# Patient Record
Sex: Female | Born: 1965 | Race: Black or African American | Hispanic: No | Marital: Married | Smoking: Former smoker
Health system: Southern US, Community
[De-identification: ages and names within clinical notes are randomized; demographics above are authoritative.]

## PROBLEM LIST (undated history)

## (undated) DIAGNOSIS — L409 Psoriasis, unspecified: Secondary | ICD-10-CM

## (undated) DIAGNOSIS — Z8619 Personal history of other infectious and parasitic diseases: Secondary | ICD-10-CM

## (undated) DIAGNOSIS — T7840XA Allergy, unspecified, initial encounter: Secondary | ICD-10-CM

## (undated) DIAGNOSIS — R7611 Nonspecific reaction to tuberculin skin test without active tuberculosis: Secondary | ICD-10-CM

## (undated) HISTORY — DX: Nonspecific reaction to tuberculin skin test without active tuberculosis: R76.11

## (undated) HISTORY — DX: Allergy, unspecified, initial encounter: T78.40XA

## (undated) HISTORY — PX: TUBAL LIGATION: SHX77

## (undated) HISTORY — DX: Personal history of other infectious and parasitic diseases: Z86.19

---

## 2007-11-21 ENCOUNTER — Encounter: Admission: RE | Admit: 2007-11-21 | Discharge: 2007-11-21 | Payer: Self-pay | Admitting: Obstetrics and Gynecology

## 2008-04-05 ENCOUNTER — Other Ambulatory Visit: Admission: RE | Admit: 2008-04-05 | Discharge: 2008-04-05 | Payer: Self-pay | Admitting: Family Medicine

## 2009-10-23 ENCOUNTER — Encounter: Admission: RE | Admit: 2009-10-23 | Discharge: 2009-10-23 | Payer: Self-pay | Admitting: Family Medicine

## 2010-01-22 ENCOUNTER — Other Ambulatory Visit
Admission: RE | Admit: 2010-01-22 | Discharge: 2010-01-22 | Payer: Self-pay | Source: Home / Self Care | Admitting: Family Medicine

## 2010-01-29 ENCOUNTER — Encounter
Admission: RE | Admit: 2010-01-29 | Discharge: 2010-01-29 | Payer: Self-pay | Source: Home / Self Care | Attending: Family Medicine | Admitting: Family Medicine

## 2010-12-18 LAB — HM MAMMOGRAPHY: HM Mammogram: NORMAL

## 2011-03-30 LAB — HM PAP SMEAR: HM PAP: NORMAL

## 2012-06-14 ENCOUNTER — Ambulatory Visit
Admission: RE | Admit: 2012-06-14 | Discharge: 2012-06-14 | Disposition: A | Discharge status: Home / Self Care | Payer: No Typology Code available for payment source | Source: Ambulatory Visit | Attending: Infectious Diseases | Admitting: Infectious Diseases

## 2012-06-14 ENCOUNTER — Other Ambulatory Visit: Payer: Self-pay | Admitting: Infectious Diseases

## 2012-06-14 DIAGNOSIS — R7611 Nonspecific reaction to tuberculin skin test without active tuberculosis: Secondary | ICD-10-CM

## 2013-03-28 ENCOUNTER — Telehealth: Payer: Self-pay

## 2013-03-28 NOTE — Telephone Encounter (Signed)
Left message for call back. Identifiable.   Pap-01/22/10-negative MMG-10/23/09-benign findings-Left breast; 01/29/10-benign cyst of right breast

## 2013-03-29 ENCOUNTER — Ambulatory Visit (INDEPENDENT_AMBULATORY_CARE_PROVIDER_SITE_OTHER): Payer: BC Managed Care – PPO | Admitting: Family Medicine

## 2013-03-29 ENCOUNTER — Encounter: Payer: Self-pay | Admitting: Family Medicine

## 2013-03-29 VITALS — BP 136/80 | HR 75 | Temp 98.1°F | Resp 16 | Ht 64.0 in | Wt 144.0 lb

## 2013-03-29 DIAGNOSIS — L409 Psoriasis, unspecified: Secondary | ICD-10-CM

## 2013-03-29 DIAGNOSIS — K219 Gastro-esophageal reflux disease without esophagitis: Secondary | ICD-10-CM

## 2013-03-29 DIAGNOSIS — L408 Other psoriasis: Secondary | ICD-10-CM

## 2013-03-29 DIAGNOSIS — Z8619 Personal history of other infectious and parasitic diseases: Secondary | ICD-10-CM

## 2013-03-29 DIAGNOSIS — R7611 Nonspecific reaction to tuberculin skin test without active tuberculosis: Secondary | ICD-10-CM | POA: Insufficient documentation

## 2013-03-29 MED ORDER — TRIAMCINOLONE ACETONIDE 0.1 % EX OINT: 1.0000 "application " | TOPICAL_OINTMENT | Freq: Two times a day (BID) | CUTANEOUS | Status: DC

## 2013-03-29 MED ORDER — PANTOPRAZOLE SODIUM 40 MG PO TBEC: 40.0000 mg | DELAYED_RELEASE_TABLET | Freq: Every day | ORAL | Status: DC

## 2013-03-29 NOTE — Assessment & Plan Note (Signed)
New to provider, ongoing for pt.  Only completed 1 month of rifampin tx (rather than the 3 months prescribed).  Will talk to ID and ask whether pt can finish last 2 months or whether she needs to start tx over.

## 2013-03-29 NOTE — Patient Instructions (Signed)
Schedule your complete physical in 3-4 months Start the Protonix daily for the reflux We'll notify you of your lab results and make any changes if needed Use the Triamcinolone twice daily on the psoriasis We'll let you know about the TB treatment Call with any questions or concerns Welcome!  We're glad to have you!

## 2013-03-29 NOTE — Assessment & Plan Note (Signed)
New to provider, ongoing for pt.  Start topical steroid ointment.  Will refer to derm prn.

## 2013-03-29 NOTE — Progress Notes (Signed)
   Subjective:    Patient ID: Madison Best, female    DOB: 08/03/1965, 48 y.o.   MRN: [id number]  HPI New to establish.  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  Hx of H pylori infxn.  Psoriasis- ongoing problem for pt, not currently being treated.  Was seeing Derm but was planning on changing doctors.  Would like to start tx w/ topical steroid.  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  Only took 1 month as opposed to 3 month recommended course.  Review of Systems For ROS see HPI     Objective:   Physical Exam  Constitutional: She is oriented to person, place, and time. She appears well-developed and well-nourished. No distress.  HENT:  Head: Normocephalic and atraumatic.  Eyes: Conjunctivae and EOM are normal. Pupils are equal, round, and reactive to light.  Neck: Normal range of motion. Neck supple. No thyromegaly present.  Cardiovascular: Normal rate, regular rhythm, normal heart sounds and intact distal pulses.   No murmur heard. Pulmonary/Chest: Effort normal and breath sounds normal. No respiratory distress.  Abdominal: Soft. She exhibits no distension. There is no tenderness.  Musculoskeletal: She exhibits no edema.  Lymphadenopathy:    She has no cervical adenopathy.  Neurological: She is alert and oriented to person, place, and time.  Skin: Skin is warm and dry.  Dry, scaling plaques on extensor surfaces of arms bilaterally  Psychiatric: She has a normal mood and affect. Her behavior is normal.          Assessment & Plan:

## 2013-03-29 NOTE — Assessment & Plan Note (Signed)
New to provider, ongoing for pt.  Start PPI.  Check labs to r/o H pylori.

## 2013-03-29 NOTE — Progress Notes (Signed)
Pre visit review using our clinic review tool, if applicable. No additional management support is needed unless otherwise documented below in the visit note. 

## 2013-03-29 NOTE — Assessment & Plan Note (Signed)
New to provider.  Pt again having sxs that could be consistent.  Check labs.  Start PPI.  Will follow.

## 2013-03-30 LAB — H. PYLORI ANTIBODY, IGG: H PYLORI IGG: NEGATIVE

## 2013-03-31 ENCOUNTER — Other Ambulatory Visit: Payer: Self-pay | Admitting: General Practice

## 2013-03-31 MED ORDER — RIFAMPIN 300 MG PO CAPS: 300.0000 mg | ORAL_CAPSULE | Freq: Two times a day (BID) | ORAL | Status: DC

## 2013-03-31 NOTE — Telephone Encounter (Signed)
Unable to reach pre visit.  

## 2013-07-25 ENCOUNTER — Encounter: Payer: Self-pay | Admitting: Family Medicine

## 2013-07-25 ENCOUNTER — Ambulatory Visit (INDEPENDENT_AMBULATORY_CARE_PROVIDER_SITE_OTHER): Payer: BC Managed Care – PPO | Admitting: Family Medicine

## 2013-07-25 VITALS — BP 120/70 | HR 67 | Temp 98.2°F | Ht 64.0 in | Wt 147.0 lb

## 2013-07-25 DIAGNOSIS — M62838 Other muscle spasm: Secondary | ICD-10-CM | POA: Insufficient documentation

## 2013-07-25 MED ORDER — MELOXICAM 15 MG PO TABS: 15.0000 mg | ORAL_TABLET | Freq: Every day | ORAL | Status: DC

## 2013-07-25 MED ORDER — CYCLOBENZAPRINE HCL 10 MG PO TABS: 10.0000 mg | ORAL_TABLET | Freq: Three times a day (TID) | ORAL | Status: DC | PRN

## 2013-07-25 NOTE — Progress Notes (Signed)
Pre visit review using our clinic review tool, if applicable. No additional management support is needed unless otherwise documented below in the visit note. 

## 2013-07-25 NOTE — Patient Instructions (Signed)
Follow up as needed This is a trapezius (muscle) spasm Start the Meloxicam daily for inflammation- take w/ food Use the cyclobenzaprine (flexeril) 3x/daily for spasm- may cause drowsiness HEAT! Do some gentle stretching- turn until you feel it pull and then hold.  Do this in all directions Call with any questions or concerns Hang in there!!

## 2013-07-25 NOTE — Progress Notes (Signed)
   Subjective:    Patient ID: Madison Best, female    DOB: 06-14-1965, 48 y.o.   MRN: [id number]  HPI L sided neck pain- started yesterday and was mild.  When she woke up this AM, 'it was awful'.  Unable to straighten neck.  + muscle spasm.  No known injury.  No radiation of pain into arm.  No HA.   Review of Systems For ROS see HPI     Objective:   Physical Exam  Vitals reviewed. Constitutional: She appears well-developed and well-nourished.  Obviously uncomfortable  HENT:  Head: Normocephalic and atraumatic.  Neck:  + L trap spasm Full flexion and extension Able to look R w/o difficulty, severe spasm and pain w/ looking L  Lymphadenopathy:    She has no cervical adenopathy.          Assessment & Plan:

## 2013-07-25 NOTE — Assessment & Plan Note (Addendum)
New.  No red flags on hx or PE.  Start scheduled NSAIDs and muscle relaxer.  Heat.  If no improvement, recommended [facility] to pt.  Reviewed supportive care and red flags that should prompt return.  Pt expressed understanding and is in agreement w/ plan.

## 2013-10-02 ENCOUNTER — Ambulatory Visit (INDEPENDENT_AMBULATORY_CARE_PROVIDER_SITE_OTHER): Payer: BC Managed Care – PPO | Admitting: Internal Medicine

## 2013-10-02 ENCOUNTER — Encounter: Payer: Self-pay | Admitting: Internal Medicine

## 2013-10-02 ENCOUNTER — Ambulatory Visit (HOSPITAL_BASED_OUTPATIENT_CLINIC_OR_DEPARTMENT_OTHER)
Admission: RE | Admit: 2013-10-02 | Discharge: 2013-10-02 | Disposition: A | Discharge status: Home / Self Care | Payer: BC Managed Care – PPO | Source: Ambulatory Visit | Attending: Internal Medicine | Admitting: Internal Medicine

## 2013-10-02 VITALS — BP 99/58 | HR 75 | Temp 98.3°F | Wt 145.4 lb

## 2013-10-02 DIAGNOSIS — S8990XA Unspecified injury of unspecified lower leg, initial encounter: Secondary | ICD-10-CM | POA: Insufficient documentation

## 2013-10-02 DIAGNOSIS — S99929A Unspecified injury of unspecified foot, initial encounter: Principal | ICD-10-CM

## 2013-10-02 DIAGNOSIS — S99919A Unspecified injury of unspecified ankle, initial encounter: Secondary | ICD-10-CM | POA: Diagnosis not present

## 2013-10-02 DIAGNOSIS — S99921A Unspecified injury of right foot, initial encounter: Secondary | ICD-10-CM

## 2013-10-02 DIAGNOSIS — W208XXA Other cause of strike by thrown, projected or falling object, initial encounter: Secondary | ICD-10-CM | POA: Insufficient documentation

## 2013-10-02 DIAGNOSIS — M7989 Other specified soft tissue disorders: Secondary | ICD-10-CM | POA: Diagnosis not present

## 2013-10-02 DIAGNOSIS — M79609 Pain in unspecified limb: Secondary | ICD-10-CM | POA: Diagnosis present

## 2013-10-02 NOTE — Progress Notes (Signed)
   Subjective:    Patient ID: Madison Best, female    DOB: 03/01/1965, 48 y.o.   MRN: [id number]  DOS:  10/02/2013 Type of visit - description: acute History: A week ago he was going down the stairs at her yard, missed a step, had a minor excoriation at the second right toe and he twisted the anterior foot. Overall , foot is now is better. 3 days later developed mild pain at the anterior knee,  more like a "ache".    ROS Denies fever chills No discharge from the excoriation No knee swelling or redness per se  Past Medical History  Diagnosis Date  . History of chicken pox   . Allergy   . Positive TB test     Past Surgical History  Procedure Laterality Date  . Tubal ligation      History   Social History  . Marital Status: Married    Spouse Name: N/A    Number of Children: N/A  . Years of Education: N/A   Occupational History  . Not on file.   Social History Main Topics  . Smoking status: Former Smoker    Quit date: 03/29/2012  . Smokeless tobacco: Never Used  . Alcohol Use: No  . Drug Use: No  . Sexual Activity: Yes   Other Topics Concern  . Not on file   Social History Narrative  . No narrative on file        Medication List       This list is accurate as of: 10/02/13  7:17 PM.  Always use your most recent med list.               cyclobenzaprine 10 MG tablet  Commonly known as:  FLEXERIL  Take 1 tablet (10 mg total) by mouth 3 (three) times daily as needed for muscle spasms.     meloxicam 15 MG tablet  Commonly known as:  MOBIC  Take 1 tablet (15 mg total) by mouth daily.     pantoprazole 40 MG tablet  Commonly known as:  PROTONIX  Take 1 tablet (40 mg total) by mouth daily.     triamcinolone ointment 0.1 %  Commonly known as:  KENALOG  Apply 1 application topically 2 (two) times daily.           Objective:   Physical Exam BP 99/58  Pulse 75  Temp(Src) 98.3 F (36.8 C) (Oral)  Wt 145 lb 6 oz (65.942 kg)  SpO2 98%  LMP  09/18/2013  General -- alert, well-developed, NAD.   Extremities--  no pretibial edema bilaterally  Knees symmetric and normal to inspection and palpation L foot normal R foot normal except for a small (3x2 mm) excoriation at 2nd toe w/o redness or d/d.  The R  2nd  toe has mild ecchymosis, minimal swelling, no deformities, and is tender at the base  Neurologic--  alert & oriented X3. Speech normal, gait appropriate for age, strength symmetric and appropriate for age.  Psych-- Cognition and judgment appear intact. Cooperative with normal attention span and concentration. No anxious or depressed appearing.      Assessment & Plan:   Injury, Toe injury , get a x-ray to be sure there is no occult fracture. If x-rays negative for recommend observation. She has a small excoriation without evidence of cellulitis, she's up-to-date on her tetanus shot.  Knee pain, exam is negative, recommend eyes, meloxicam and observation.

## 2013-10-02 NOTE — Progress Notes (Signed)
Pre-visit discussion using our clinic review tool. No additional management support is needed unless otherwise documented below in the visit note.  

## 2013-10-02 NOTE — Patient Instructions (Signed)
Get the XR at [facility] [facility], corner [address] (10 minutes form here); they are open 24/7 [address]  [place], [place] [address] [phone]  Take meloxicam once a day as needed ICE the knee twice a day as needed  Call if no better in few days

## 2014-01-03 ENCOUNTER — Ambulatory Visit (INDEPENDENT_AMBULATORY_CARE_PROVIDER_SITE_OTHER): Payer: BC Managed Care – PPO | Admitting: Family Medicine

## 2014-01-03 ENCOUNTER — Encounter: Payer: Self-pay | Admitting: Family Medicine

## 2014-01-03 VITALS — BP 106/76 | HR 75 | Temp 98.4°F | Resp 16 | Ht 63.5 in | Wt 135.2 lb

## 2014-01-03 DIAGNOSIS — B360 Pityriasis versicolor: Secondary | ICD-10-CM

## 2014-01-03 DIAGNOSIS — Z0000 Encounter for general adult medical examination without abnormal findings: Secondary | ICD-10-CM

## 2014-01-03 DIAGNOSIS — Z1231 Encounter for screening mammogram for malignant neoplasm of breast: Secondary | ICD-10-CM

## 2014-01-03 MED ORDER — KETOCONAZOLE 2 % EX CREA: 1.0000 "application " | TOPICAL_CREAM | Freq: Every day | CUTANEOUS | Status: DC

## 2014-01-03 NOTE — Progress Notes (Signed)
   Subjective:    Patient ID: Madison Best, female    DOB: 06/02/1965, 48 y.o.   MRN: [id number]  HPI CPE- due for mammo, too young for colonoscopy, UTD on pap- due next year.   Review of Systems Patient reports no vision/ hearing changes, adenopathy,fever, weight change,  persistant/recurrent hoarseness , swallowing issues, chest pain, palpitations, edema, persistant/recurrent cough, hemoptysis, dyspnea (rest/exertional/paroxysmal nocturnal), gastrointestinal bleeding (melena, rectal bleeding), abdominal pain, significant heartburn, bowel changes, GU symptoms (dysuria, hematuria, incontinence), Gyn symptoms (abnormal  bleeding, pain),  syncope, focal weakness, memory loss, numbness & tingling, skin/hair/nail changes, abnormal bruising or bleeding, anxiety, or depression.     Objective:   Physical Exam General Appearance:    Alert, cooperative, no distress, appears stated age  Head:    Normocephalic, without obvious abnormality, atraumatic  Eyes:    PERRL, conjunctiva/corneas clear, EOM's intact, fundi    benign, both eyes  Ears:    Normal TM's and external ear canals, both ears  Nose:   Nares normal, septum midline, mucosa normal, no drainage    or sinus tenderness  Throat:   Lips, mucosa, and tongue normal; teeth and gums normal  Neck:   Supple, symmetrical, trachea midline, no adenopathy;    Thyroid: no enlargement/tenderness/nodules  Back:     Symmetric, no curvature, ROM normal, no CVA tenderness  Lungs:     Clear to auscultation bilaterally, respirations unlabored  Chest Wall:    No tenderness or deformity   Heart:    Regular rate and rhythm, S1 and S2 normal, no murmur, rub   or gallop  Breast Exam:    Deferred to GYN  Abdomen:     Soft, non-tender, bowel sounds active all four quadrants,    no masses, no organomegaly  Genitalia:    Deferred to GYN  Rectal:    Extremities:   Extremities normal, atraumatic, no cyanosis or edema  Pulses:   2+ and symmetric all extremities  Skin:    Skin color, texture, turgor normal, tinea versicolor on chest  Lymph nodes:   Cervical, supraclavicular, and axillary nodes normal  Neurologic:   CNII-XII intact, normal strength, sensation and reflexes    throughout          Assessment & Plan:

## 2014-01-03 NOTE — Assessment & Plan Note (Signed)
Pt's PE WNL.  UTD on pap- due next year.  Overdue on mammo- order entered.  Check labs.  Anticipatory guidance provided.

## 2014-01-03 NOTE — Assessment & Plan Note (Signed)
mammo ordered.

## 2014-01-03 NOTE — Assessment & Plan Note (Signed)
New.  Reviewed dx w/ pt.  Start ketoconazole cream.

## 2014-01-03 NOTE — Patient Instructions (Signed)
Follow up in 1 year or as needed We'll notify you of your lab results and make any changes if needed We'll call you with your mammogram appt Apply the ketoconazole cream daily for the tinea versicolor Call with any questions or concerns Happy Holidays!!!

## 2014-01-03 NOTE — Progress Notes (Signed)
Pre visit review using our clinic review tool, if applicable. No additional management support is needed unless otherwise documented below in the visit note. 

## 2014-01-04 LAB — LIPID PANEL
CHOLESTEROL: 111 mg/dL (ref 0–200)
HDL: 41.6 mg/dL (ref >39.00)
LDL CALC: 52 mg/dL (ref 0–99)
NonHDL: 69.4
Total CHOL/HDL Ratio: 3
Triglycerides: 86 mg/dL (ref 0.0–149.0)
VLDL: 17.2 mg/dL (ref 0.0–40.0)

## 2014-01-04 LAB — BASIC METABOLIC PANEL
BUN: 8 mg/dL (ref 6–23)
CALCIUM: 9.2 mg/dL (ref 8.4–10.5)
CO2: 25 mEq/L (ref 19–32)
CREATININE: 0.6 mg/dL (ref 0.4–1.2)
Chloride: 104 mEq/L (ref 96–112)
GFR: 134.71 mL/min (ref >60.00)
Glucose, Bld: 58 mg/dL — ABNORMAL LOW (ref 70–99)
POTASSIUM: 3.9 meq/L (ref 3.5–5.1)
Sodium: 137 mEq/L (ref 135–145)

## 2014-01-04 LAB — CBC WITH DIFFERENTIAL/PLATELET
BASOS ABS: 0 10*3/uL (ref 0.0–0.1)
BASOS PCT: 0.3 % (ref 0.0–3.0)
EOS ABS: 0.1 10*3/uL (ref 0.0–0.7)
EOS PCT: 1.5 % (ref 0.0–5.0)
HCT: 38.4 % (ref 36.0–46.0)
Hemoglobin: 12.3 g/dL (ref 12.0–15.0)
LYMPHS ABS: 2.9 10*3/uL (ref 0.7–4.0)
Lymphocytes Relative: 32.7 % (ref 12.0–46.0)
MCHC: 32 g/dL (ref 30.0–36.0)
MCV: 92.8 fl (ref 78.0–100.0)
Monocytes Absolute: 0.4 10*3/uL (ref 0.1–1.0)
Monocytes Relative: 4.2 % (ref 3.0–12.0)
NEUTROS ABS: 5.4 10*3/uL (ref 1.4–7.7)
NEUTROS PCT: 61.3 % (ref 43.0–77.0)
Platelets: 339 10*3/uL (ref 150.0–400.0)
RBC: 4.14 Mil/uL (ref 3.87–5.11)
RDW: 13.6 % (ref 11.5–15.5)
WBC: 8.9 10*3/uL (ref 4.0–10.5)

## 2014-01-04 LAB — HEPATIC FUNCTION PANEL
ALBUMIN: 4.3 g/dL (ref 3.5–5.2)
ALK PHOS: 48 U/L (ref 39–117)
ALT: 11 U/L (ref 0–35)
AST: 15 U/L (ref 0–37)
BILIRUBIN DIRECT: 0 mg/dL (ref 0.0–0.3)
Total Bilirubin: 0.3 mg/dL (ref 0.2–1.2)
Total Protein: 6.9 g/dL (ref 6.0–8.3)

## 2014-01-04 LAB — TSH: TSH: 0.86 u[IU]/mL (ref 0.35–4.50)

## 2014-01-04 LAB — VITAMIN D 25 HYDROXY (VIT D DEFICIENCY, FRACTURES): VITD: 14.62 ng/mL — ABNORMAL LOW (ref 30.00–100.00)

## 2014-01-05 ENCOUNTER — Other Ambulatory Visit: Payer: Self-pay | Admitting: General Practice

## 2014-01-05 MED ORDER — VITAMIN D (ERGOCALCIFEROL) 1.25 MG (50000 UNIT) PO CAPS: 50000.0000 [IU] | ORAL_CAPSULE | ORAL | Status: DC

## 2014-01-30 ENCOUNTER — Ambulatory Visit: Payer: BC Managed Care – PPO

## 2014-02-06 ENCOUNTER — Ambulatory Visit
Admission: RE | Admit: 2014-02-06 | Discharge: 2014-02-06 | Disposition: A | Discharge status: Home / Self Care | Payer: BC Managed Care – PPO | Source: Ambulatory Visit | Attending: Family Medicine | Admitting: Family Medicine

## 2014-02-06 DIAGNOSIS — Z1231 Encounter for screening mammogram for malignant neoplasm of breast: Secondary | ICD-10-CM

## 2014-07-23 ENCOUNTER — Encounter: Payer: Self-pay | Admitting: Medical

## 2014-07-23 ENCOUNTER — Ambulatory Visit (INDEPENDENT_AMBULATORY_CARE_PROVIDER_SITE_OTHER): Payer: BLUE CROSS/BLUE SHIELD | Admitting: Medical

## 2014-07-23 VITALS — BP 130/80 | HR 81 | Temp 98.6°F | Ht 63.5 in | Wt 135.0 lb

## 2014-07-23 DIAGNOSIS — T148 Other injury of unspecified body region: Secondary | ICD-10-CM

## 2014-07-23 DIAGNOSIS — W57XXXA Bitten or stung by nonvenomous insect and other nonvenomous arthropods, initial encounter: Secondary | ICD-10-CM | POA: Diagnosis not present

## 2014-07-23 MED ORDER — DOXYCYCLINE HYCLATE 100 MG PO TABS: ORAL_TABLET | ORAL | Status: DC

## 2014-07-23 NOTE — Patient Instructions (Signed)
Tick bite  Tick with mild early skin infection secondary to trauma/bite. Rx doxycycline. Rx advisement given.  Tick bit studies. Follow up in 7 days or as needed.

## 2014-07-23 NOTE — Assessment & Plan Note (Signed)
Tick with mild early skin infection secondary to trauma/bite. Rx doxycycline. Rx advisement given.  Tick bit studies. Follow up in 7 days or as needed.

## 2014-07-23 NOTE — Progress Notes (Signed)
   Subjective:    Patient ID: Madison Best, female    DOB: 06/25/1965, 49 y.o.   MRN: [id number]  HPI  Pt felt tick on her back on Tuesday. Pt daughter pulled it off with her finger. Area was itching for about 6 days. No pain. No fever, and no chills.  Pt not sexualy active and tubal ligation in the past.   Review of Systems  Constitutional: Negative for fever, chills and fatigue.  Respiratory: Negative for cough, chest tightness, shortness of breath and wheezing.   Cardiovascular: Negative for chest pain and palpitations.  Musculoskeletal: Negative for myalgias and arthralgias.  Skin: Positive for rash.       Small tick bite left side of back with scab. Faint redness around scab.    Past Medical History  Diagnosis Date  . History of chicken pox   . Allergy   . Positive TB test     History   Social History  . Marital Status: Married    Spouse Name: N/A  . Number of Children: N/A  . Years of Education: N/A   Occupational History  . Not on file.   Social History Main Topics  . Smoking status: Current Every Day Smoker    Last Attempt to Quit: 03/29/2012  . Smokeless tobacco: Never Used  . Alcohol Use: No  . Drug Use: No  . Sexual Activity: Yes   Other Topics Concern  . Not on file   Social History Narrative    Past Surgical History  Procedure Laterality Date  . Tubal ligation      Family History  Problem Relation Age of Onset  . Arthritis Mother   . Hypertension Mother   . Diabetes Father   . Cancer Sister     Breast/Brain tumors  . Stroke Paternal Grandmother   . Diabetes Paternal Grandmother     No Known Allergies  Current Outpatient Prescriptions on File Prior to Visit  Medication Sig Dispense Refill  . ketoconazole (NIZORAL) 2 % cream Apply 1 application topically daily. 60 g 1  . triamcinolone ointment (KENALOG) 0.1 % Apply 1 application topically 2 (two) times daily. 90 g 1  . Vitamin D, Ergocalciferol, (DRISDOL) 50000 UNITS CAPS capsule Take  1 capsule (50,000 Units total) by mouth every 7 (seven) days. 12 capsule 0   No current facility-administered medications on file prior to visit.    BP 130/80 mmHg  Pulse 81  Temp(Src) 98.6 F (37 C) (Oral)  Ht 5' 3.5" (1.613 m)  Wt 135 lb (61.236 kg)  BMI 23.54 kg/m2  SpO2 98%  LMP 07/19/2014       Objective:   Physical Exam  General- No acute distress. Pleasant patient. Neck- Full range of motion, no jvd Lungs- Clear, even and unlabored. Heart- regular rate and rhythm. Neurologic- CNII- XII grossly intact. Skin- small red, indurated area and scab where tick was attached beneath left scapula area.      Assessment & Plan:

## 2014-07-23 NOTE — Progress Notes (Signed)
Pre visit review using our clinic review tool, if applicable. No additional management support is needed unless otherwise documented below in the visit note. 

## 2014-07-24 LAB — LYME ABY, WSTRN BLT IGG & IGM W/BANDS
B BURGDORFERI IGG ABS (IB): NEGATIVE
B burgdorferi IgM Abs (IB): NEGATIVE
LYME DISEASE 28 KD IGG: NONREACTIVE
LYME DISEASE 30 KD IGG: NONREACTIVE
LYME DISEASE 66 KD IGG: NONREACTIVE
Lyme Disease 18 kD IgG: NONREACTIVE
Lyme Disease 23 kD IgG: NONREACTIVE
Lyme Disease 23 kD IgM: NONREACTIVE
Lyme Disease 39 kD IgG: NONREACTIVE
Lyme Disease 39 kD IgM: NONREACTIVE
Lyme Disease 41 kD IgG: NONREACTIVE
Lyme Disease 41 kD IgM: NONREACTIVE
Lyme Disease 45 kD IgG: NONREACTIVE
Lyme Disease 58 kD IgG: NONREACTIVE
Lyme Disease 93 kD IgG: NONREACTIVE

## 2014-07-24 LAB — ROCKY MTN SPOTTED FVR ABS PNL(IGG+IGM)
RMSF IGG: 0.21 IV
RMSF IGM: 0.84 IV

## 2015-01-07 ENCOUNTER — Encounter: Payer: BC Managed Care – PPO | Admitting: Family Medicine

## 2015-01-17 ENCOUNTER — Ambulatory Visit (INDEPENDENT_AMBULATORY_CARE_PROVIDER_SITE_OTHER): Payer: BLUE CROSS/BLUE SHIELD | Admitting: Family Medicine

## 2015-01-17 ENCOUNTER — Encounter: Payer: Self-pay | Admitting: Family Medicine

## 2015-01-17 VITALS — BP 124/84 | HR 92 | Temp 97.9°F | Resp 17 | Ht 64.0 in | Wt 139.0 lb

## 2015-01-17 DIAGNOSIS — Z0000 Encounter for general adult medical examination without abnormal findings: Secondary | ICD-10-CM

## 2015-01-17 NOTE — Progress Notes (Signed)
Pre visit review using our clinic review tool, if applicable. No additional management support is needed unless otherwise documented below in the visit note. 

## 2015-01-17 NOTE — Patient Instructions (Signed)
Follow up in 1 year or as needed We'll notify you of your lab results and make any changes if needed Keep up the good work on healthy diet and regular exercise Schedule your pap at your GYN Schedule your mammogram for the end of the month or the 1st of the year Call with any questions or concerns If you want to [place] new [place], any scheduled appointments will automatically transfer and we will see you at [address], [place], [place] [address] (OPENING 02/19/15) Happy Holidays!!!

## 2015-01-17 NOTE — Progress Notes (Signed)
   Subjective:    Patient ID: Madison Best, female    DOB: 02/02/1966, 49 y.o.   MRN: [id number]  HPI CPE- due for pap (has GYN but unable to recall name), mammo due the end of this month.  No concerns today.   Review of Systems Patient reports no vision/ hearing changes, adenopathy,fever, weight change,  persistant/recurrent hoarseness , swallowing issues, chest pain, palpitations, edema, persistant/recurrent cough, hemoptysis, dyspnea (rest/exertional/paroxysmal nocturnal), gastrointestinal bleeding (melena, rectal bleeding), abdominal pain, significant heartburn, bowel changes, GU symptoms (dysuria, hematuria, incontinence), Gyn symptoms (abnormal  bleeding, pain),  syncope, focal weakness, memory loss, numbness & tingling, skin/hair/nail changes, abnormal bruising or bleeding, anxiety, or depression.     Objective:   Physical Exam General Appearance:    Alert, cooperative, no distress, appears stated age  Head:    Normocephalic, without obvious abnormality, atraumatic  Eyes:    PERRL, conjunctiva/corneas clear, EOM's intact, fundi    benign, both eyes  Ears:    Normal TM's and external ear canals, both ears  Nose:   Nares normal, septum midline, mucosa normal, no drainage    or sinus tenderness  Throat:   Lips, mucosa, and tongue normal; teeth and gums normal  Neck:   Supple, symmetrical, trachea midline, no adenopathy;    Thyroid: no enlargement/tenderness/nodules  Back:     Symmetric, no curvature, ROM normal, no CVA tenderness  Lungs:     Clear to auscultation bilaterally, respirations unlabored  Chest Wall:    No tenderness or deformity   Heart:    Regular rate and rhythm, S1 and S2 normal, no murmur, rub   or gallop  Breast Exam:    Deferred to GYN  Abdomen:     Soft, non-tender, bowel sounds active all four quadrants,    no masses, no organomegaly  Genitalia:    Deferred to GYN  Rectal:    Extremities:   Extremities normal, atraumatic, no cyanosis or edema  Pulses:   2+ and  symmetric all extremities  Skin:   Skin color, texture, turgor normal, no rashes or lesions  Lymph nodes:   Cervical, supraclavicular, and axillary nodes normal  Neurologic:   CNII-XII intact, normal strength, sensation and reflexes    throughout          Assessment & Plan:

## 2015-01-17 NOTE — Assessment & Plan Note (Signed)
Pt's PE WNL.  Due for pap- pt plans to schedule w/ GYN.  Pt encouraged to schedule upcoming mammo.  Check labs.  Anticipatory guidance provided.

## 2015-01-18 ENCOUNTER — Other Ambulatory Visit: Payer: Self-pay | Admitting: General Practice

## 2015-01-18 LAB — CBC WITH DIFFERENTIAL/PLATELET
BASOS ABS: 0.2 10*3/uL — AB (ref 0.0–0.1)
Basophils Relative: 1.6 % (ref 0.0–3.0)
Eosinophils Absolute: 0.3 10*3/uL (ref 0.0–0.7)
Eosinophils Relative: 3.2 % (ref 0.0–5.0)
HCT: 39.3 % (ref 36.0–46.0)
Hemoglobin: 12.8 g/dL (ref 12.0–15.0)
LYMPHS ABS: 2.6 10*3/uL (ref 0.7–4.0)
Lymphocytes Relative: 26.3 % (ref 12.0–46.0)
MCHC: 32.7 g/dL (ref 30.0–36.0)
MCV: 91.2 fl (ref 78.0–100.0)
MONOS PCT: 3.8 % (ref 3.0–12.0)
Monocytes Absolute: 0.4 10*3/uL (ref 0.1–1.0)
NEUTROS PCT: 65.1 % (ref 43.0–77.0)
Neutro Abs: 6.4 10*3/uL (ref 1.4–7.7)
Platelets: 349 10*3/uL (ref 150.0–400.0)
RBC: 4.31 Mil/uL (ref 3.87–5.11)
RDW: 13.1 % (ref 11.5–15.5)
WBC: 9.8 10*3/uL (ref 4.0–10.5)

## 2015-01-18 LAB — HEPATIC FUNCTION PANEL
ALBUMIN: 4 g/dL (ref 3.5–5.2)
ALK PHOS: 62 U/L (ref 39–117)
ALT: 8 U/L (ref 0–35)
AST: 12 U/L (ref 0–37)
Bilirubin, Direct: 0 mg/dL (ref 0.0–0.3)
TOTAL PROTEIN: 6.9 g/dL (ref 6.0–8.3)
Total Bilirubin: 0.2 mg/dL (ref 0.2–1.2)

## 2015-01-18 LAB — LIPID PANEL
Cholesterol: 121 mg/dL (ref 0–200)
HDL: 39.4 mg/dL (ref >39.00)
LDL CALC: 61 mg/dL (ref 0–99)
NonHDL: 81.18
Total CHOL/HDL Ratio: 3
Triglycerides: 102 mg/dL (ref 0.0–149.0)
VLDL: 20.4 mg/dL (ref 0.0–40.0)

## 2015-01-18 LAB — BASIC METABOLIC PANEL
BUN: 11 mg/dL (ref 6–23)
CHLORIDE: 104 meq/L (ref 96–112)
CO2: 29 meq/L (ref 19–32)
CREATININE: 0.74 mg/dL (ref 0.40–1.20)
Calcium: 9.2 mg/dL (ref 8.4–10.5)
GFR: 107.32 mL/min (ref >60.00)
GLUCOSE: 107 mg/dL — AB (ref 70–99)
Potassium: 3.9 mEq/L (ref 3.5–5.1)
Sodium: 140 mEq/L (ref 135–145)

## 2015-01-18 LAB — TSH: TSH: 0.9 u[IU]/mL (ref 0.35–4.50)

## 2015-01-18 LAB — VITAMIN D 25 HYDROXY (VIT D DEFICIENCY, FRACTURES): VITD: 11.38 ng/mL — AB (ref 30.00–100.00)

## 2015-01-18 MED ORDER — VITAMIN D (ERGOCALCIFEROL) 1.25 MG (50000 UNIT) PO CAPS: 50000.0000 [IU] | ORAL_CAPSULE | ORAL | Status: DC

## 2015-03-22 ENCOUNTER — Encounter: Payer: BLUE CROSS/BLUE SHIELD | Admitting: Family Medicine

## 2015-09-20 ENCOUNTER — Telehealth: Payer: Self-pay | Admitting: Family Medicine

## 2015-09-20 NOTE — Telephone Encounter (Signed)
please schedule.     KP

## 2015-09-20 NOTE — Telephone Encounter (Signed)
Ok to give

## 2015-09-20 NOTE — Telephone Encounter (Signed)
Caller name: Junelle Relation to pt: self Call back number: [phone] Pharmacy:  Reason for call: Pt is requesting needs Tetanus Shot since school is requesting for her to have it done and is needing it ASAP. Please advise if possible today.

## 2015-09-20 NOTE — Telephone Encounter (Signed)
She has not established with Dr.Lowne yet. Apt pending 01/20/16. Please advise    KP

## 2015-09-23 NOTE — Telephone Encounter (Signed)
Pt was called and scheduled an appt for tomorrow 09-24-15.

## 2015-09-24 ENCOUNTER — Ambulatory Visit (INDEPENDENT_AMBULATORY_CARE_PROVIDER_SITE_OTHER): Payer: BLUE CROSS/BLUE SHIELD | Admitting: *Deleted

## 2015-09-24 DIAGNOSIS — Z23 Encounter for immunization: Secondary | ICD-10-CM | POA: Diagnosis not present

## 2015-09-24 NOTE — Progress Notes (Signed)
Pre visit review using our clinic review tool, if applicable. No additional management support is needed unless otherwise documented below in the visit note.  Patient tolerated injection well.  Shalona Harbour J Ramyah Pankowski, RN 

## 2015-11-05 ENCOUNTER — Ambulatory Visit (INDEPENDENT_AMBULATORY_CARE_PROVIDER_SITE_OTHER): Payer: BLUE CROSS/BLUE SHIELD | Admitting: Physician Assistant

## 2015-11-05 ENCOUNTER — Encounter: Payer: Self-pay | Admitting: Physician Assistant

## 2015-11-05 VITALS — BP 100/62 | HR 99 | Temp 98.3°F | Resp 18 | Ht 64.0 in | Wt 137.4 lb

## 2015-11-05 DIAGNOSIS — G43009 Migraine without aura, not intractable, without status migrainosus: Secondary | ICD-10-CM | POA: Diagnosis not present

## 2015-11-05 DIAGNOSIS — J069 Acute upper respiratory infection, unspecified: Secondary | ICD-10-CM | POA: Diagnosis not present

## 2015-11-05 DIAGNOSIS — B9789 Other viral agents as the cause of diseases classified elsewhere: Principal | ICD-10-CM

## 2015-11-05 MED ORDER — FLUTICASONE PROPIONATE 50 MCG/ACT NA SUSP: 2.0000 | Freq: Every day | NASAL | 6 refills | Status: DC

## 2015-11-05 MED ORDER — BENZONATATE 100 MG PO CAPS: 100.0000 mg | ORAL_CAPSULE | Freq: Three times a day (TID) | ORAL | 0 refills | Status: DC | PRN

## 2015-11-05 MED ORDER — BUTALBITAL-ASPIRIN-CAFFEINE 50-325-40 MG PO CAPS: 1.0000 | ORAL_CAPSULE | Freq: Two times a day (BID) | ORAL | 0 refills | Status: DC | PRN

## 2015-11-05 NOTE — Patient Instructions (Signed)
Please stay well hydrated. Get plenty of rest. Place a humidifier in the bedroom.  I have given you a prescription for Tessalon to use as directed for cough. Also use the Flonase as directed to remove fluid from behind the ears. Tylenol or Ibuprofen for aches.  Symptoms seem related to a viral illness. Your body will fight this off. Follow the instructions above. An over-the-counter zinc supplement may also help your body fight this of quicker.  Follow-up if symptoms are not resolving.  For migraines -- stay hydrated and eat a well-balanced diet. Avoid skipping meals. Try to relax when possible as stress is a trigger for headaches. Take Fiorinal no more than as directed. Follow-up with Dr. Laury AxonLowne as scheduled.

## 2015-11-05 NOTE — Progress Notes (Signed)
Patient presents to clinic today c/o 1 day of sinus pressure, sinus congestion, fatigue, aches and chills with sudden onset. Denies fever. Does note some mild reflux in the chest. Denies recent travel or sick contact. 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 Has not had flu shot.  Has taken Tylenol for symptoms. Patient is currently a smoker. Denies known history of asthma or COPD.   Endorses migraines headaches with photophobia happening over the past few months. Denies vision changes with headache. Denies nausea or vomiting. Denies head trauma or injury. Notes increased stressors that she feels is related. Has only taken OTC tylenol for headaches.  Past Medical History:  Diagnosis Date  . Allergy   . History of chicken pox   . Positive TB test     Current Outpatient Prescriptions on File Prior to Visit  Medication Sig Dispense Refill  . ketoconazole (Madison Best) 2 % cream Apply 1 application topically daily. 60 g 1  . triamcinolone ointment (Madison Best) 0.1 % Apply 1 application topically 2 (two) times daily. 90 g 1  . Vitamin D, Ergocalciferol, (Madison Best) 50000 UNITS CAPS capsule Take 1 capsule (50,000 Units total) by mouth every 7 (seven) days. (Patient not taking: Reported on 11/05/2015) 12 capsule 0   No current facility-administered medications on file prior to visit.     No Known Allergies  Family History  Problem Relation Age of Onset  . Arthritis Mother   . Hypertension Mother   . Diabetes Father   . Cancer Sister     Breast/Brain tumors  . Stroke Paternal Grandmother   . Diabetes Paternal Grandmother     Social History   Social History  . Marital status: Married    Spouse name: N/A  . Number of children: N/A  . Years of education: N/A   Social History Main Topics  . Smoking status: Current Every Day Smoker    Last attempt to quit: 03/29/2012  . Smokeless tobacco: Never Used  . Alcohol use No  . Drug use: No  . Sexual activity: Yes   Other Topics Concern  . None    Social History Narrative  . None   Review of Systems - See HPI.  All other ROS are negative.  BP 100/62 (BP Location: Right Arm, Patient Position: Sitting, Cuff Size: Normal)   Pulse 99   Temp 98.3 F (36.8 C) (Oral)   Resp 18   Ht 5\' 4"  (1.626 m)   Wt 137 lb 6 oz (62.3 kg)   LMP 10/29/2015   SpO2 98%   BMI 23.58 kg/m   Physical Exam  Constitutional: She is oriented to person, place, and time and well-developed, well-nourished, and in no distress.  HENT:  Head: Normocephalic and atraumatic.  Right Ear: Tympanic membrane is not erythematous and not bulging.  Left Ear: Tympanic membrane is not erythematous and not bulging.  Nose: Nose normal.  Mouth/Throat: Oropharynx is clear and moist. No oropharyngeal exudate.  Mild clear fluid noted behind TM bilaterally.  Eyes: Conjunctivae are normal.  Neck: Neck supple.  Cardiovascular: Normal rate, regular rhythm, normal heart sounds and intact distal pulses.   Pulmonary/Chest: Breath sounds normal. No respiratory distress. She has no wheezes. She has no rales. She exhibits no tenderness.  Lymphadenopathy:    She has no cervical adenopathy.  Neurological: She is alert and oriented to person, place, and time.  Skin: Skin is warm and dry. No rash noted.  Psychiatric: Affect normal.  Vitals reviewed.  Assessment/Plan: 1. Viral  URI with cough 1 day of symptoms. Afebrile. Exam looks good. Will initiate supportive measures. Rx Flonase and Tessalon. Return precautions discussed with patient.  - benzonatate (TESSALON) 100 MG capsule; Take 1 capsule (100 mg total) by mouth 3 (three) times daily as needed.  Dispense: 30 capsule; Refill: 0 - fluticasone (FLONASE) 50 MCG/ACT nasal spray; Place 2 sprays into both nostrils daily.  Dispense: 16 g; Refill: 6  2. Migraine without aura and without status migrainosus, not intractable Discussed supportive measures and avoidance of triggers. Rx Madison Best to use for severe migraine. Otherwise Excedrin  OTC. FU with PCP for further management. - butalbital-aspirin-caffeine (Madison Best) 50-325-40 MG capsule; Take 1 capsule by mouth 2 (two) times daily as needed for headache.  Dispense: 14 capsule; Refill: 0   Madison Best, [place]

## 2015-11-27 ENCOUNTER — Other Ambulatory Visit: Payer: Self-pay | Admitting: Family Medicine

## 2015-11-27 ENCOUNTER — Telehealth: Payer: Self-pay | Admitting: Family Medicine

## 2015-11-27 DIAGNOSIS — Z1231 Encounter for screening mammogram for malignant neoplasm of breast: Secondary | ICD-10-CM

## 2015-11-27 NOTE — Telephone Encounter (Signed)
Pt has switched to Dr Laury AxonLowne so she will need to decide if the pt needs an appt to be seen or she can enter the mammo w/o an appt

## 2015-11-27 NOTE — Telephone Encounter (Signed)
Pt called in. She says that she found a lump in her breast last night and would like to have a referral to the breast center to have a mammogram .  Please assist further.

## 2015-11-27 NOTE — Telephone Encounter (Signed)
lvm for pt. Advised that an office visit is needed.

## 2015-11-27 NOTE — Telephone Encounter (Signed)
Pt would need diagnostic mammogram so she would need a ov she we know exactly what to tel breast center

## 2015-11-27 NOTE — Telephone Encounter (Signed)
Pt last seen on 01/17/15 for CPE, ok for screening mammogram?

## 2015-11-28 ENCOUNTER — Ambulatory Visit (INDEPENDENT_AMBULATORY_CARE_PROVIDER_SITE_OTHER): Payer: BLUE CROSS/BLUE SHIELD | Admitting: Medical

## 2015-11-28 ENCOUNTER — Encounter: Payer: Self-pay | Admitting: Medical

## 2015-11-28 VITALS — BP 120/82 | HR 84 | Temp 98.4°F | Ht 64.0 in | Wt 139.8 lb

## 2015-11-28 DIAGNOSIS — N63 Unspecified lump in unspecified breast: Secondary | ICD-10-CM | POA: Diagnosis not present

## 2015-11-28 NOTE — Patient Instructions (Addendum)
For your breast lump  and family history we set you up for diagnostic imaging studies. Appointment is for Nov 29, 2015.(@2 :10 for 2:30) Number is [phone].  Follow up with us as needed

## 2015-11-28 NOTE — Progress Notes (Signed)
Subjective:    Patient ID: Madison Best, female    DOB: Apr 19, 1965, 49 y.o.   MRN: [id number]  HPI   Pt in today for lump that she felt a lump on her left breast. She felt in night before last. Pt last mammogram 2015 and was normal. Pt states  Grandmother and sister had breast cancer. Her sister had complications from breast CA with metastasis. Sister dx 43-42 yo. No dc from pt  breast. No lump in axillary areas.   Pt did have breast biopsy one time in her 30's. At one point they were following an area every 6 months. But stopped years ago. Former biopsy was on her right side.   Pt contacted breast center but they told her to come here.  Pt had tubal ligation.      Review of Systems  Constitutional: Negative for chills, fatigue and fever.  Respiratory: Negative for cough, shortness of breath and wheezing.   Cardiovascular: Negative for chest pain and palpitations.  Skin:       Left side breast lump.  Hematological: Negative for adenopathy. Does not bruise/bleed easily.  Psychiatric/Behavioral: Negative for confusion. The patient is not nervous/anxious.        Does not appear overly anxious presently.    Past Medical History:  Diagnosis Date  . Allergy   . History of chicken pox   . Positive TB test      Social History   Social History  . Marital status: Married    Spouse name: N/A  . Number of children: N/A  . Years of education: N/A   Occupational History  . Not on file.   Social History Main Topics  . Smoking status: Current Every Day Smoker    Last attempt to quit: 03/29/2012  . Smokeless tobacco: Never Used  . Alcohol use No  . Drug use: No  . Sexual activity: Yes   Other Topics Concern  . Not on file   Social History Narrative  . No narrative on file    Past Surgical History:  Procedure Laterality Date  . TUBAL LIGATION      Family History  Problem Relation Age of Onset  . Arthritis Mother   . Hypertension Mother   . Diabetes Father   .  Cancer Sister     Breast/Brain tumors  . Stroke Paternal Grandmother   . Diabetes Paternal Grandmother     No Known Allergies  Current Outpatient Prescriptions on File Prior to Visit  Medication Sig Dispense Refill  . benzonatate (TESSALON) 100 MG capsule Take 1 capsule (100 mg total) by mouth 3 (three) times daily as needed. 30 capsule 0  . butalbital-aspirin-caffeine (FIORINAL) 50-325-40 MG capsule Take 1 capsule by mouth 2 (two) times daily as needed for headache. 14 capsule 0  . fluticasone (FLONASE) 50 MCG/ACT nasal spray Place 2 sprays into both nostrils daily. 16 g 6  . ketoconazole (NIZORAL) 2 % cream Apply 1 application topically daily. 60 g 1  . Vitamin D, Ergocalciferol, (DRISDOL) 50000 UNITS CAPS capsule Take 1 capsule (50,000 Units total) by mouth every 7 (seven) days. 12 capsule 0  . triamcinolone ointment (KENALOG) 0.1 % Apply 1 application topically 2 (two) times daily. (Patient not taking: Reported on 11/28/2015) 90 g 1   No current facility-administered medications on file prior to visit.     BP 120/82   Pulse 84   Temp 98.4 F (36.9 C) (Oral)   Ht 5\' 4"  (1.626 m)  Wt 139 lb 12.8 oz (63.4 kg)   LMP 10/17/2015   SpO2 100%   BMI 24.00 kg/m       Objective:   Physical Exam  General- No acute distress. Pleasant patient. Lungs- Clear, even and unlabored. Heart- regular rate and rhythm. Neurologic- CNII- XII grossly intact.  Breast- symmetric in size. Smaller sized  breast tissue. Lt nipple indented but baseline per pt. No lumps or masses rt side. No lymph nodes felt in either axillary area.Lt of nipple 2.5 cm in diameter lump. Not tender. No redness or warmth to breast.      Assessment & Plan:  For your breast lump  and family history we set you up for diagnostic imaging studies. Appointment is for Nov 29, 2015.(@2 :10 for 2:30) Number is [phone].  Follow up with us as needed  Note after pt left I tried to call her to ask lmp. But no answer.(Note  finding today again exam does not feel fibrocystic in my opinion.). Also hx of tubal ligation.

## 2015-11-28 NOTE — Progress Notes (Signed)
Pre visit review using our clinic tool,if applicable. No additional management support is needed unless otherwise documented below in the visit note.  

## 2015-11-28 NOTE — Telephone Encounter (Signed)
Patient has an ov appointment on 01/20/16.

## 2015-12-02 ENCOUNTER — Ambulatory Visit
Admission: RE | Admit: 2015-12-02 | Discharge: 2015-12-02 | Disposition: A | Discharge status: Home / Self Care | Payer: BLUE CROSS/BLUE SHIELD | Source: Ambulatory Visit | Attending: Medical | Admitting: Medical

## 2015-12-02 DIAGNOSIS — N63 Unspecified lump in unspecified breast: Secondary | ICD-10-CM

## 2016-01-20 ENCOUNTER — Telehealth: Payer: Self-pay | Admitting: Behavioral Health

## 2016-01-20 ENCOUNTER — Encounter: Payer: BLUE CROSS/BLUE SHIELD | Admitting: Family Medicine

## 2016-01-20 NOTE — Telephone Encounter (Signed)
Unable to reach patient at time of Pre-Visit Call.  Left message for patient to return call when available.    

## 2016-01-21 ENCOUNTER — Encounter: Payer: Self-pay | Admitting: Family Medicine

## 2016-01-21 ENCOUNTER — Ambulatory Visit (INDEPENDENT_AMBULATORY_CARE_PROVIDER_SITE_OTHER): Payer: BLUE CROSS/BLUE SHIELD | Admitting: Family Medicine

## 2016-01-21 ENCOUNTER — Other Ambulatory Visit (HOSPITAL_COMMUNITY)
Admission: RE | Admit: 2016-01-21 | Discharge: 2016-01-21 | Disposition: A | Discharge status: Home / Self Care | Payer: BLUE CROSS/BLUE SHIELD | Source: Ambulatory Visit | Attending: Family Medicine | Admitting: Family Medicine

## 2016-01-21 VITALS — BP 112/79 | HR 95 | Temp 98.2°F | Resp 16 | Ht 64.0 in | Wt 136.6 lb

## 2016-01-21 DIAGNOSIS — Z0000 Encounter for general adult medical examination without abnormal findings: Secondary | ICD-10-CM

## 2016-01-21 DIAGNOSIS — G43009 Migraine without aura, not intractable, without status migrainosus: Secondary | ICD-10-CM | POA: Diagnosis not present

## 2016-01-21 DIAGNOSIS — Z23 Encounter for immunization: Secondary | ICD-10-CM

## 2016-01-21 DIAGNOSIS — Z01411 Encounter for gynecological examination (general) (routine) with abnormal findings: Secondary | ICD-10-CM | POA: Diagnosis not present

## 2016-01-21 LAB — POCT URINALYSIS DIPSTICK
Bilirubin, UA: NEGATIVE
GLUCOSE UA: NEGATIVE
Ketones, UA: NEGATIVE
Leukocytes, UA: NEGATIVE
NITRITE UA: NEGATIVE
PROTEIN UA: NEGATIVE
RBC UA: NEGATIVE
Spec Grav, UA: 1.025
UROBILINOGEN UA: 0.2
pH, UA: 6

## 2016-01-21 LAB — COMPREHENSIVE METABOLIC PANEL
ALBUMIN: 4.3 g/dL (ref 3.5–5.2)
ALT: 11 U/L (ref 0–35)
AST: 15 U/L (ref 0–37)
Alkaline Phosphatase: 57 U/L (ref 39–117)
BUN: 8 mg/dL (ref 6–23)
CHLORIDE: 104 meq/L (ref 96–112)
CO2: 25 meq/L (ref 19–32)
CREATININE: 0.69 mg/dL (ref 0.40–1.20)
Calcium: 9.4 mg/dL (ref 8.4–10.5)
GFR: 115.86 mL/min (ref >60.00)
GLUCOSE: 86 mg/dL (ref 70–99)
Potassium: 4 mEq/L (ref 3.5–5.1)
SODIUM: 137 meq/L (ref 135–145)
Total Bilirubin: 0.4 mg/dL (ref 0.2–1.2)
Total Protein: 7.2 g/dL (ref 6.0–8.3)

## 2016-01-21 LAB — CBC WITH DIFFERENTIAL/PLATELET
BASOS PCT: 0.6 % (ref 0.0–3.0)
Basophils Absolute: 0 10*3/uL (ref 0.0–0.1)
EOS ABS: 0.2 10*3/uL (ref 0.0–0.7)
Eosinophils Relative: 2.2 % (ref 0.0–5.0)
HCT: 40.1 % (ref 36.0–46.0)
Hemoglobin: 13.3 g/dL (ref 12.0–15.0)
LYMPHS ABS: 2.4 10*3/uL (ref 0.7–4.0)
Lymphocytes Relative: 32.3 % (ref 12.0–46.0)
MCHC: 33.3 g/dL (ref 30.0–36.0)
MCV: 89.3 fl (ref 78.0–100.0)
MONO ABS: 0.6 10*3/uL (ref 0.1–1.0)
Monocytes Relative: 7.5 % (ref 3.0–12.0)
NEUTROS ABS: 4.3 10*3/uL (ref 1.4–7.7)
Neutrophils Relative %: 57.4 % (ref 43.0–77.0)
PLATELETS: 367 10*3/uL (ref 150.0–400.0)
RBC: 4.49 Mil/uL (ref 3.87–5.11)
RDW: 13 % (ref 11.5–15.5)
WBC: 7.6 10*3/uL (ref 4.0–10.5)

## 2016-01-21 LAB — LIPID PANEL
CHOLESTEROL: 116 mg/dL (ref 0–200)
HDL: 49.4 mg/dL (ref >39.00)
LDL Cholesterol: 57 mg/dL (ref 0–99)
NonHDL: 66.6
TRIGLYCERIDES: 50 mg/dL (ref 0.0–149.0)
Total CHOL/HDL Ratio: 2
VLDL: 10 mg/dL (ref 0.0–40.0)

## 2016-01-21 LAB — TSH: TSH: 0.92 u[IU]/mL (ref 0.35–4.50)

## 2016-01-21 MED ORDER — BUTALBITAL-ASPIRIN-CAFFEINE 50-325-40 MG PO CAPS: 1.0000 | ORAL_CAPSULE | Freq: Two times a day (BID) | ORAL | 0 refills | Status: DC | PRN

## 2016-01-21 NOTE — Addendum Note (Signed)
Addended by: Vergie LivingBECKETT, Helen Cuff S on: 01/21/2016 02:20 PM   Modules accepted: Orders

## 2016-01-21 NOTE — Progress Notes (Signed)
Pre visit review using our clinic review tool, if applicable. No additional management support is needed unless otherwise documented below in the visit note. 

## 2016-01-21 NOTE — Patient Instructions (Signed)

## 2016-01-21 NOTE — Progress Notes (Signed)
Subjective:     Madison Best is a 50 y.o. female and is here for a comprehensive physical exam. The patient reports inc migraines-- start in am and late evening.  She was see for this previously and given fiorcet but never filled rx.  She is getting 3-4 a week .  They last a few hours --- tylenol and rest help.   No vision changes.    Social History   Social History  . Marital status: Married    Spouse name: N/A  . Number of children: N/A  . Years of education: N/A   Occupational History  . Not on file.   Social History Main Topics  . Smoking status: Current Every Day Smoker    Last attempt to quit: 03/29/2012  . Smokeless tobacco: Never Used  . Alcohol use No  . Drug use: No  . Sexual activity: Yes   Other Topics Concern  . Not on file   Social History Narrative  . No narrative on file   Health Maintenance  Topic Date Due  . HIV Screening  02/24/1981  . PAP SMEAR  04/20/2016 (Originally 03/29/2014)  . INFLUENZA VACCINE  01/20/2017 (Originally 09/17/2015)  . MAMMOGRAM  12/01/2016  . TETANUS/TDAP  09/23/2025    The following portions of the patient's history were reviewed and updated as appropriate: She  has a past medical history of Allergy; History of chicken pox; and Positive TB test. She  does not have any pertinent problems on file. She  has a past surgical history that includes Tubal ligation. Her family history includes Arthritis in her mother; Cancer in her sister; Diabetes in her father and paternal grandmother; Hypertension in her mother; Stroke in her paternal grandmother. She  reports that she has been smoking.  She has never used smokeless tobacco. She reports that she does not drink alcohol or use drugs. She has a current medication list which includes the following prescription(s): benzonatate, fluticasone, butalbital-aspirin-caffeine, triamcinolone ointment, and vitamin d (ergocalciferol). Current Outpatient Prescriptions on File Prior to Visit  Medication Sig  Dispense Refill  . benzonatate (TESSALON) 100 MG capsule Take 1 capsule (100 mg total) by mouth 3 (three) times daily as needed. 30 capsule 0  . fluticasone (FLONASE) 50 MCG/ACT nasal spray Place 2 sprays into both nostrils daily. 16 g 6  . triamcinolone ointment (KENALOG) 0.1 % Apply 1 application topically 2 (two) times daily. (Patient not taking: Reported on 01/21/2016) 90 g 1  . Vitamin D, Ergocalciferol, (DRISDOL) 50000 UNITS CAPS capsule Take 1 capsule (50,000 Units total) by mouth every 7 (seven) days. (Patient not taking: Reported on 01/21/2016) 12 capsule 0   No current facility-administered medications on file prior to visit.    She has No Known Allergies..  Review of Systems Review of Systems  Constitutional: Negative for activity change, appetite change and fatigue.  HENT: Negative for hearing loss, congestion, tinnitus and ear discharge.  dentist q947m Eyes: Negative for visual disturbance (see optho q1y -- vision corrected to 20/20 with glasses).  Respiratory: Negative for cough, chest tightness and shortness of breath.   Cardiovascular: Negative for chest pain, palpitations and leg swelling.  Gastrointestinal: Negative for abdominal pain, diarrhea, constipation and abdominal distention.  Genitourinary: Negative for urgency, frequency, decreased urine volume and difficulty urinating.  Musculoskeletal: Negative for back pain, arthralgias and gait problem.  Skin: Negative for color change, pallor and rash.  Neurological: Negative for dizziness, light-headedness, numbness + headaches Hematological: Negative for adenopathy. Does not bruise/bleed easily.  Psychiatric/Behavioral: Negative for suicidal ideas, confusion, sleep disturbance, self-injury, dysphoric mood, decreased concentration and agitation.      Objective:    BP 112/79 (BP Location: Left Arm, Patient Position: Sitting, Cuff Size: Normal)   Pulse 95   Temp 98.2 F (36.8 C) (Oral)   Resp 16   Ht 5\' 4"  (1.626 m)    Wt 136 lb 9.6 oz (62 kg)   LMP 01/07/2016 (Approximate)   SpO2 98%   BMI 23.45 kg/m  General appearance: alert, cooperative, appears stated age and no distress Head: Normocephalic, without obvious abnormality, atraumatic Eyes: conjunctivae/corneas clear. PERRL, EOM's intact. Fundi benign. Ears: normal TM's and external ear canals both ears Nose: Nares normal. Septum midline. Mucosa normal. No drainage or sinus tenderness. Throat: lips, mucosa, and tongue normal; teeth and gums normal Neck: no adenopathy, no carotid bruit, no JVD, supple, symmetrical, trachea midline and thyroid not enlarged, symmetric, no tenderness/mass/nodules Back: symmetric, no curvature. ROM normal. No CVA tenderness. Lungs: clear to auscultation bilaterally Breasts: normal appearance, no masses or tenderness Heart: regular rate and rhythm, S1, S2 normal, no murmur, click, rub or gallop Abdomen: soft, non-tender; bowel sounds normal; no masses,  no organomegaly Pelvic: cervix normal in appearance, external genitalia normal, no adnexal masses or tenderness, no cervical motion tenderness, rectovaginal septum normal, uterus normal size, shape, and consistency, vagina normal without discharge and pap done , rectal heme neg brown stool Extremities: extremities normal, atraumatic, no cyanosis or edema, no edema, redness or tenderness in the calves or thighs, no ulcers, gangrene or trophic changes and venous stasis dermatitis noted Pulses: 2+ and symmetric Skin: Skin color, texture, turgor normal. No rashes or lesions Lymph nodes: Cervical, supraclavicular, and axillary nodes normal. Neurologic: Alert and oriented X 3, normal strength and tone. Normal symmetric reflexes. Normal coordination and gait    Assessment:    Healthy female exam.     Plan:    ghm utd Check labske See After Visit Summary for Counseling Recommendations    1. Migraine without aura and without status migrainosus, not intractable She never got rx  filled Tylenol and rest help rto if symptoms do not subside when classes are over this week   - butalbital-aspirin-caffeine (FIORINAL) 50-325-40 MG capsule; Take 1 capsule by mouth 2 (two) times daily as needed for headache.  Dispense: 14 capsule; Refill: 0  2. Preventative health care See above - CBC with Differential/Platelet - Lipid panel - TSH - Vitamin D 1,25 dihydroxy - POCT urinalysis dipstick - Comprehensive metabolic panel Flu shot given

## 2016-01-23 LAB — CYTOLOGY - PAP

## 2016-01-24 LAB — VITAMIN D 1,25 DIHYDROXY
VITAMIN D 1, 25 (OH) TOTAL: 44 pg/mL (ref 18–72)
VITAMIN D3 1, 25 (OH): 44 pg/mL

## 2016-02-12 ENCOUNTER — Ambulatory Visit (INDEPENDENT_AMBULATORY_CARE_PROVIDER_SITE_OTHER): Payer: BLUE CROSS/BLUE SHIELD | Admitting: Family Medicine

## 2016-02-12 ENCOUNTER — Encounter: Payer: Self-pay | Admitting: Family Medicine

## 2016-02-12 ENCOUNTER — Ambulatory Visit (HOSPITAL_BASED_OUTPATIENT_CLINIC_OR_DEPARTMENT_OTHER)
Admission: RE | Admit: 2016-02-12 | Discharge: 2016-02-12 | Disposition: A | Discharge status: Home / Self Care | Payer: BLUE CROSS/BLUE SHIELD | Source: Ambulatory Visit | Attending: Family Medicine | Admitting: Family Medicine

## 2016-02-12 VITALS — BP 106/62 | HR 85 | Temp 97.9°F | Ht 63.0 in | Wt 142.2 lb

## 2016-02-12 DIAGNOSIS — S92515A Nondisplaced fracture of proximal phalanx of left lesser toe(s), initial encounter for closed fracture: Secondary | ICD-10-CM | POA: Insufficient documentation

## 2016-02-12 DIAGNOSIS — M79675 Pain in left toe(s): Secondary | ICD-10-CM | POA: Diagnosis present

## 2016-02-12 DIAGNOSIS — X58XXXA Exposure to other specified factors, initial encounter: Secondary | ICD-10-CM | POA: Insufficient documentation

## 2016-02-12 NOTE — Patient Instructions (Signed)
Stay in flat shoe for the next 2-3 weeks. OK to go off of it when you start walking with no pain.  Motrin and Tylenol for pain.

## 2016-02-12 NOTE — Progress Notes (Signed)
Pre visit review using our clinic review tool, if applicable. No additional management support is needed unless otherwise documented below in the visit note. 

## 2016-02-12 NOTE — Progress Notes (Signed)
Musculoskeletal Exam  Patient: Madison Best DOB: 01/11/1966  DOS: 02/12/2016  SUBJECTIVE:  Chief Complaint:   Chief Complaint  Patient presents with  . Toe Pain    Pt reports toe pain x 4days/ Pt states she hit her toe on the dresser and it has been hurting ever since     Madison Best is a 50 y.o.  female for evaluation and treatment of L 5th toe pain.   Onset:  3 days ago. She was turning around and hit her pinky toe on dresser.  Location: Prox 5th digit Character:  sharp  Progression of issue:  is unchanged Associated symptoms: Swelling, bruising, pain with walking Treatment: to date has been rest and OTC NSAIDS.   Neurovascular symptoms: no  ROS: Musculoskeletal/Extremities: +toe pain Neurologic: no numbness, tingling no weakness   Past Medical History:  Diagnosis Date  . Allergy   . History of chicken pox   . Positive TB test    Past Surgical History:  Procedure Laterality Date  . TUBAL LIGATION     Family History  Problem Relation Age of Onset  . Arthritis Mother   . Hypertension Mother   . Diabetes Father   . Cancer Sister     Breast/Brain tumors  . Stroke Paternal Grandmother   . Diabetes Paternal Grandmother    Current Outpatient Prescriptions  Medication Sig Dispense Refill  . butalbital-aspirin-caffeine (FIORINAL) 50-325-40 MG capsule Take 1 capsule by mouth 2 (two) times daily as needed for headache. 14 capsule 0  . fluticasone (FLONASE) 50 MCG/ACT nasal spray Place 2 sprays into both nostrils daily. 16 g 6  . triamcinolone ointment (KENALOG) 0.1 % Apply 1 application topically 2 (two) times daily. 90 g 1  . Vitamin D, Ergocalciferol, (DRISDOL) 50000 UNITS CAPS capsule Take 1 capsule (50,000 Units total) by mouth every 7 (seven) days. 12 capsule 0  . benzonatate (TESSALON) 100 MG capsule Take 1 capsule (100 mg total) by mouth 3 (three) times daily as needed. (Patient not taking: Reported on 02/12/2016) 30 capsule 0   No Known Allergies Social  History   Social History  . Marital status: Married   Social History Main Topics  . Smoking status: Current Every Day Smoker    Last attempt to quit: 03/29/2012  . Smokeless tobacco: Never Used  . Alcohol use No  . Drug use: No  . Sexual activity: Yes   Objective: VITAL SIGNS: BP 106/62 (BP Location: Left Arm, Patient Position: Sitting, Cuff Size: Small)   Pulse 85   Temp 97.9 F (36.6 C) (Oral)   Ht 5\' 3"  (1.6 m)   Wt 142 lb 3.2 oz (64.5 kg)   LMP 01/07/2016 (Approximate)   SpO2 98%   BMI 25.19 kg/m  Constitutional: Well formed, well developed. No acute distress. Cardiovascular: Brisk cap refill Thorax & Lungs: No accessory muscle use Musculoskeletal: L 5th digit.   Tenderness to palpation: yes- prox phalanx Deformity: no Ecchymosis: yes Neurologic: Normal sensory function. No focal deficits noted. No clonus. Psychiatric: Normal mood. Age appropriate judgment and insight. Alert & oriented x 3.    Assessment:  Pain of toe of left foot - Plan: DG Toe 5th Left  Plan: Orders as above. XR unofficially shows nondisplaced Fifth proximal phalanx fracture. No dislocation appreciated. She is placed in a flat shoe and recommended to wear over the next 2-3 weeks. I will see her in 3 weeks to reimage and make sure no displacement has taken effect. Motrin and Tylenol for pain. The  patient voiced understanding and agreement to the plan.   Jilda Rocheicholas Paul ParmaWendling, DO 02/12/16  4:45 PM

## 2016-03-05 ENCOUNTER — Ambulatory Visit: Payer: BLUE CROSS/BLUE SHIELD | Admitting: Family Medicine

## 2016-03-12 ENCOUNTER — Other Ambulatory Visit: Payer: Self-pay | Admitting: Family Medicine

## 2016-03-12 ENCOUNTER — Ambulatory Visit (INDEPENDENT_AMBULATORY_CARE_PROVIDER_SITE_OTHER): Payer: Self-pay | Admitting: Family Medicine

## 2016-03-12 ENCOUNTER — Ambulatory Visit: Payer: BLUE CROSS/BLUE SHIELD | Admitting: Family Medicine

## 2016-03-12 ENCOUNTER — Encounter: Payer: Self-pay | Admitting: Family Medicine

## 2016-03-12 ENCOUNTER — Ambulatory Visit (HOSPITAL_BASED_OUTPATIENT_CLINIC_OR_DEPARTMENT_OTHER)
Admission: RE | Admit: 2016-03-12 | Discharge: 2016-03-12 | Disposition: A | Discharge status: Home / Self Care | Payer: Self-pay | Source: Ambulatory Visit | Attending: Family Medicine | Admitting: Family Medicine

## 2016-03-12 VITALS — BP 108/68 | HR 82 | Temp 98.0°F | Ht 63.0 in | Wt 141.1 lb

## 2016-03-12 DIAGNOSIS — S92515D Nondisplaced fracture of proximal phalanx of left lesser toe(s), subsequent encounter for fracture with routine healing: Secondary | ICD-10-CM

## 2016-03-12 DIAGNOSIS — X58XXXD Exposure to other specified factors, subsequent encounter: Secondary | ICD-10-CM | POA: Insufficient documentation

## 2016-03-12 NOTE — Progress Notes (Signed)
Pre visit review using our clinic review tool, if applicable. No additional management support is needed unless otherwise documented below in the visit note. 

## 2016-03-12 NOTE — Progress Notes (Signed)
Chief Complaint  Patient presents with  . Toe Pain    Subjective: Patient is a 51 y.o. female here for f/u toe fx.  Seen in Dec for toe pain from an injury (stubbed toe). Found a nondisplaced fx of prox phalanx. She was placed in a surgical shoe and told to f/u for reimaging to ensure adequate healing. Pt states the swelling and pain have gotten much better.  Objective: BP 108/68 (BP Location: Left Arm, Patient Position: Sitting, Cuff Size: Normal)   Pulse 82   Temp 98 F (36.7 C) (Oral)   Ht 5\' 3"  (1.6 m)   Wt 141 lb 2 oz (64 kg)   LMP 03/05/2016   SpO2 98%   BMI 25.00 kg/m  General: Awake, appears stated age MSK: Mild TTP over prox phalanx, no swelling or ecchymosis like last time Neuro: Sensation intact to light touch Psych: Age appropriate judgment and insight, normal affect and mood  Assessment and Plan: Closed nondisplaced fracture of proximal phalanx of lesser toe of left foot with routine healing, subsequent encounter - Plan: DG Toe 5th Left  Orders as above. Prior to exam, pt was supposed to have XR but my order was changed. Ordered correct toe this time and will speak with office staff to make sure that she is not billed for both imaging procedures.  OK to go off of flat soled shoe. 6-12 weeks for complete resolution. I will let her know if anything changes in the plan. F/u prn otherwise. The patient voiced understanding and agreement to the plan.  Jilda Rocheicholas Paul PlessisWendling, DO 03/12/16  10:30 AM

## 2016-11-30 ENCOUNTER — Emergency Department (HOSPITAL_BASED_OUTPATIENT_CLINIC_OR_DEPARTMENT_OTHER)
Admission: EM | Admit: 2016-11-30 | Discharge: 2016-11-30 | Disposition: A | Discharge status: Home / Self Care | Payer: BLUE CROSS/BLUE SHIELD | Attending: Emergency Medicine | Admitting: Emergency Medicine

## 2016-11-30 ENCOUNTER — Encounter (HOSPITAL_BASED_OUTPATIENT_CLINIC_OR_DEPARTMENT_OTHER): Payer: Self-pay

## 2016-11-30 DIAGNOSIS — J0100 Acute maxillary sinusitis, unspecified: Secondary | ICD-10-CM | POA: Diagnosis not present

## 2016-11-30 DIAGNOSIS — R05 Cough: Secondary | ICD-10-CM | POA: Diagnosis present

## 2016-11-30 DIAGNOSIS — Z87891 Personal history of nicotine dependence: Secondary | ICD-10-CM | POA: Insufficient documentation

## 2016-11-30 HISTORY — DX: Psoriasis, unspecified: L40.9

## 2016-11-30 MED ORDER — AZITHROMYCIN 250 MG PO TABS: 250.0000 mg | ORAL_TABLET | Freq: Every day | ORAL | 0 refills | Status: DC

## 2016-11-30 NOTE — ED Notes (Signed)
ED Provider at bedside. 

## 2016-11-30 NOTE — ED Provider Notes (Signed)
[facility] EMERGENCY DEPARTMENT Provider Note   CSN: [id number] Arrival date & time: 11/30/16  1633     History   Chief Complaint Chief Complaint  Patient presents with  . Cough    HPI Madison Best is a 51 y.o. female.  The history is provided by the patient.  Cough  This is a new problem. The current episode started yesterday. The problem occurs hourly. The problem has not changed since onset.The cough is non-productive. There has been no fever. Associated symptoms include ear congestion, headaches, rhinorrhea, sore throat and myalgias. Pertinent negatives include no shortness of breath and no wheezing. Treatments tried: nasal rinse with some help. She is not a smoker.    Past Medical History:  Diagnosis Date  . Allergy   . History of chicken pox   . Positive TB test   . Psoriasis     Patient Active Problem List   Diagnosis Date Noted  . Tick bite 07/23/2014  . Physical exam 01/03/2014  . Visit for screening mammogram 01/03/2014  . Tinea versicolor 01/03/2014  . Trapezius muscle spasm 07/25/2013  . GERD (gastroesophageal reflux disease) 03/29/2013  . History of Helicobacter pylori infection 03/29/2013  . Psoriasis 03/29/2013  . 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 TUBAL LIGATION      OB History    No data available       Home Medications    Prior to Admission medications   Not on File    Family History Family History  Problem Relation Age of Onset  . Arthritis Mother   . Hypertension Mother   . Diabetes Father   . Cancer Sister        Breast/Brain tumors  . Stroke Paternal Grandmother   . Diabetes Paternal Grandmother     Social History Social History  Substance Use Topics  . Smoking status: Former Games developer  . Smokeless tobacco: Never Used  . Alcohol use No     Allergies   Patient has no known allergies.   Review of Systems Review of Systems  HENT: Positive for rhinorrhea  and sore throat.   Respiratory: Positive for cough. Negative for shortness of breath and wheezing.   Musculoskeletal: Positive for myalgias.  Neurological: Positive for headaches.  All other systems reviewed and are negative.    Physical Exam Updated Vital Signs BP (!) 120/99 (BP Location: Left Arm)   Pulse 88   Temp 97.9 F (36.6 C) (Oral)   Resp 16   Wt 64.6 kg (142 lb 6.7 oz)   LMP  (LMP Unknown)   SpO2 99%   BMI 25.23 kg/m   Physical Exam  Constitutional: She is oriented to person, place, and time. She appears well-developed and well-nourished. No distress.  HENT:  Head: Normocephalic and atraumatic.  Right Ear: Tympanic membrane normal.  Left Ear: A middle ear effusion is present.  Nose: Mucosal edema and rhinorrhea present. Right sinus exhibits maxillary sinus tenderness and frontal sinus tenderness. Left sinus exhibits maxillary sinus tenderness and frontal sinus tenderness.  Mouth/Throat: Posterior oropharyngeal erythema present.  Eyes: Pupils are equal, round, and reactive to light. Conjunctivae and EOM are normal.  Neck: Normal range of motion. Neck supple.  Cardiovascular: Normal rate, regular rhythm and intact distal pulses.   No murmur heard. Pulmonary/Chest: Effort normal and breath sounds normal. No respiratory distress. She has no wheezes. She has no rales.  Musculoskeletal: Normal range of motion. She exhibits no  edema or tenderness.  Neurological: She is alert and oriented to person, place, and time.  Skin: Skin is warm and dry. No rash noted. No erythema.  Psychiatric: She has a normal mood and affect. Her behavior is normal.  Nursing note and vitals reviewed.    ED Treatments / Results  Labs (all labs ordered are listed, but only abnormal results are displayed) Labs Reviewed - No data to display  EKG  EKG Interpretation None       Radiology No results found.  Procedures Procedures (including critical care time)  Medications Ordered in  ED Medications - No data to display   Initial Impression / Assessment and Plan / ED Course  I have reviewed the triage vital signs and the nursing notes.  Pertinent labs & imaging results that were available during my care of the patient were reviewed by me and considered in my medical decision making (see chart for details).     Patient with symptoms most suggestive of a sinus infection with constant drainage down the back of the throat causing a sore throat. There is no evidence of strep on exam. No tonsillar exudates or swelling. She is afebrile. She does have significant sinus tenderness and symptoms of now been going on for almost 2 weeks. Patient treated with azithromycin for sinus infection. Recommended follow-up with PCP if not improved.   Final Clinical Impressions(s) / ED Diagnoses   Final diagnoses:  Acute non-recurrent maxillary sinusitis    New Prescriptions New Prescriptions   AZITHROMYCIN (ZITHROMAX) 250 MG TABLET    Take 1 tablet (250 mg total) by mouth daily. Take first 2 tablets together, then 1 every day until finished.     Gwyneth Sprout, MD 11/30/16 [phone]

## 2016-11-30 NOTE — ED Triage Notes (Signed)
C/o flu like sx x 1.5 weeks-NAD-steady gait 

## 2017-01-22 ENCOUNTER — Encounter: Payer: BLUE CROSS/BLUE SHIELD | Admitting: Family Medicine

## 2017-03-17 ENCOUNTER — Other Ambulatory Visit: Payer: Self-pay | Admitting: Internal Medicine

## 2017-03-17 DIAGNOSIS — Z1231 Encounter for screening mammogram for malignant neoplasm of breast: Secondary | ICD-10-CM

## 2017-04-02 ENCOUNTER — Ambulatory Visit
Admission: RE | Admit: 2017-04-02 | Discharge: 2017-04-02 | Disposition: A | Discharge status: Home / Self Care | Payer: BLUE CROSS/BLUE SHIELD | Source: Ambulatory Visit | Attending: Internal Medicine | Admitting: Internal Medicine

## 2017-04-02 DIAGNOSIS — Z1231 Encounter for screening mammogram for malignant neoplasm of breast: Secondary | ICD-10-CM

## 2017-05-18 ENCOUNTER — Emergency Department (HOSPITAL_BASED_OUTPATIENT_CLINIC_OR_DEPARTMENT_OTHER)
Admission: EM | Admit: 2017-05-18 | Discharge: 2017-05-18 | Disposition: A | Discharge status: Home / Self Care | Payer: BLUE CROSS/BLUE SHIELD | Attending: Emergency Medicine | Admitting: Emergency Medicine

## 2017-05-18 ENCOUNTER — Emergency Department (HOSPITAL_BASED_OUTPATIENT_CLINIC_OR_DEPARTMENT_OTHER): Payer: BLUE CROSS/BLUE SHIELD

## 2017-05-18 ENCOUNTER — Other Ambulatory Visit: Payer: Self-pay

## 2017-05-18 ENCOUNTER — Encounter (HOSPITAL_BASED_OUTPATIENT_CLINIC_OR_DEPARTMENT_OTHER): Payer: Self-pay

## 2017-05-18 DIAGNOSIS — Y92013 Bedroom of single-family (private) house as the place of occurrence of the external cause: Secondary | ICD-10-CM | POA: Insufficient documentation

## 2017-05-18 DIAGNOSIS — S92511A Displaced fracture of proximal phalanx of right lesser toe(s), initial encounter for closed fracture: Secondary | ICD-10-CM | POA: Insufficient documentation

## 2017-05-18 DIAGNOSIS — W2203XA Walked into furniture, initial encounter: Secondary | ICD-10-CM | POA: Insufficient documentation

## 2017-05-18 DIAGNOSIS — Y998 Other external cause status: Secondary | ICD-10-CM | POA: Insufficient documentation

## 2017-05-18 DIAGNOSIS — Y9301 Activity, walking, marching and hiking: Secondary | ICD-10-CM | POA: Insufficient documentation

## 2017-05-18 DIAGNOSIS — Z87891 Personal history of nicotine dependence: Secondary | ICD-10-CM | POA: Insufficient documentation

## 2017-05-18 NOTE — ED Triage Notes (Signed)
Pt states she injured right pinky toe yesterday-NAD-limping gait

## 2017-05-18 NOTE — Discharge Instructions (Signed)
Buddy tape your toes for comfort until the fracture heals. Use post-op shoe as needed for comfort. Ice and elevate foot throughout the day, using ice pack for no more than 20 minutes every hour.  Alternate between tylenol and ibuprofen as needed for pain relief. Follow up with your regular doctor in 1 week for recheck of your foot injury. Return to the ER for changes or worsening symptoms.

## 2017-05-18 NOTE — ED Notes (Signed)
Patient transported to X-ray 

## 2017-05-18 NOTE — ED Provider Notes (Signed)
[facility] EMERGENCY DEPARTMENT Provider Note   CSN: [id number] Arrival date & time: 05/18/17  1127     History   Chief Complaint Chief Complaint  Patient presents with  . Toe Injury    HPI Madison Best is a 52 y.o. female with a PMHx of psoriasis and GERD, who presents to the ED with complaints of R pinky toe pain x1 day after she accidentally stubbed it on the edge of her dresser last night.  She describes her pain as 9/10 intermittent with weight bearing sharp nonradiating R pinky toe pain which worsens with weight bearing and has been unrelieved with ibuprofen.  She reports associated swelling and bruising.  She denies cuts/abrasions to the skin, numbness, tingling, focal weakness, or any other injuries sustained or complaints at this time.   The history is provided by the patient and medical records. No language interpreter was used.  Toe Pain  This is a new problem. The current episode started yesterday. The problem occurs daily. The problem has not changed since onset.The symptoms are aggravated by walking and standing. Nothing relieves the symptoms. Treatments tried: ibuprofen. The treatment provided no relief.    Past Medical History:  Diagnosis Date  . Allergy   . History of chicken pox   . Positive TB test   . Psoriasis     Patient Active Problem List   Diagnosis Date Noted  . Tick bite 07/23/2014  . Physical exam 01/03/2014  . Visit for screening mammogram 01/03/2014  . Tinea versicolor 01/03/2014  . Trapezius muscle spasm 07/25/2013  . GERD (gastroesophageal reflux disease) 03/29/2013  . History of Helicobacter pylori infection 03/29/2013  . Psoriasis 03/29/2013  . 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 TUBAL LIGATION       OB History   None      Home Medications    Prior to Admission medications   Medication Sig Start Date End Date Taking? Authorizing Provider  azithromycin (ZITHROMAX) 250  MG tablet Take 1 tablet (250 mg total) by mouth daily. Take first 2 tablets together, then 1 every day until finished. 11/30/16   Gwyneth Sprout, MD    Family History Family History  Problem Relation Age of Onset  . Arthritis Mother   . Hypertension Mother   . Diabetes Father   . Cancer Sister        Breast/Brain tumors  . Stroke Paternal Grandmother   . Diabetes Paternal Grandmother   . Breast cancer Neg Hx     Social History Social History   Tobacco Use  . Smoking status: Former Games developer  . Smokeless tobacco: Never Used  Substance Use Topics  . Alcohol use: No  . Drug use: No     Allergies   Patient has no known allergies.   Review of Systems Review of Systems  Musculoskeletal: Positive for arthralgias and joint swelling.  Skin: Positive for color change. Negative for wound.  Allergic/Immunologic: Negative for immunocompromised state.  Neurological: Negative for weakness and numbness.  Psychiatric/Behavioral: Negative for confusion.   All other systems reviewed and are negative for acute change except as noted in the HPI.    Physical Exam Updated Vital Signs BP 127/84 (BP Location: Left Arm)   Pulse 84   Temp 98 F (36.7 C) (Oral)   Resp 18   Ht 5\' 3"  (1.6 m)   Wt 63 kg (138 lb 14.2 oz)   SpO2 97%   BMI  24.60 kg/m   Physical Exam  Constitutional: She is oriented to person, place, and time. Vital signs are normal. She appears well-developed and well-nourished.  Non-toxic appearance. No distress.  Afebrile, nontoxic, NAD  HENT:  Head: Normocephalic and atraumatic.  Mouth/Throat: Mucous membranes are normal.  Eyes: Conjunctivae and EOM are normal. Right eye exhibits no discharge. Left eye exhibits no discharge.  Neck: Normal range of motion. Neck supple.  Cardiovascular: Normal rate and intact distal pulses.  Pulmonary/Chest: Effort normal. No respiratory distress.  Abdominal: Normal appearance. She exhibits no distension.  Musculoskeletal: Normal  range of motion.       Right foot: There is tenderness, bony tenderness and swelling. There is normal range of motion, normal capillary refill, no crepitus, no deformity and no laceration.  R foot with mild TTP over pinky toe, no other foot/ankle tenderness, mild bruising and swelling to pinky toe, no crepitus or deformity, wiggles all digits without difficulty, no skin wounds, sensation grossly intact, distal pulses intact, compartments soft.   Neurological: She is alert and oriented to person, place, and time. She has normal strength. No sensory deficit.  Skin: Skin is warm, dry and intact. No rash noted.  Psychiatric: She has a normal mood and affect. Her behavior is normal.  Nursing note and vitals reviewed.    ED Treatments / Results  Labs (all labs ordered are listed, but only abnormal results are displayed) Labs Reviewed - No data to display  EKG None  Radiology Dg Toe 5th Right  Result Date: 05/18/2017 CLINICAL DATA:  Wreck trauma to the fifth toe on a dresser. Bruising and pain persists. EXAM: RIGHT FIFTH TOE COMPARISON:  Right foot series dated October 02, 2013 FINDINGS: There is a mildly displaced spiral fracture of the shaft of the proximal phalanx of the fifth toe. The middle and distal phalanges are fused and appear intact. The visualized portions of the fifth metatarsal are normal. IMPRESSION: Acute mildly displaced spiral fracture of the shaft of the proximal phalanx of the right fifth toe. Electronically Signed   By: David  SwazilandJordan M.D.   On: 05/18/2017 11:59    Procedures Procedures (including critical care time)  Medications Ordered in ED Medications - No data to display   Initial Impression / Assessment and Plan / ED Course  I have reviewed the triage vital signs and the nursing notes.  Pertinent labs & imaging results that were available during my care of the patient were reviewed by me and considered in my medical decision making (see chart for details).      52 y.o. female here with R pinky toe injury sustained yesterday after she accidentally hit it on the dresser. On exam, mild TTP to pinky itself, no foot/ankle tenderness, no crepitus/deformity, mild swelling and bruising around the pinky toe, wiggles toes without difficulty, NVI with soft compartments. Xray confirms mildly displaced fx of shaft of proximal phalanx. Although it's mildly displaced, it's still fairly well aligned. Will buddy tape and place in post op boot, advised tylenol/motrin/RICE for pain and f/up with PCP in 1wk for recheck. I explained the diagnosis and have given explicit precautions to return to the ER including for any other new or worsening symptoms. The patient understands and accepts the medical plan as it's been dictated and I have answered their questions. Discharge instructions concerning home care and prescriptions have been given. The patient is STABLE and is discharged to home in good condition.    Final Clinical Impressions(s) / ED Diagnoses  Final diagnoses:  Displaced fracture of proximal phalanx of right lesser toe(s), initial encounter for closed fracture    ED Discharge Orders    [address] [place], [place] 05/18/17 1242    Loren Racer, MD 05/19/17 0730

## 2018-03-17 IMAGING — MG DIGITAL SCREENING BILATERAL MAMMOGRAM WITH TOMO AND CAD
8 series · 9 of 24 positions shown · non-contrast
Comparison: Previous exam(s).

CLINICAL DATA: Screening.

EXAM:
DIGITAL SCREENING BILATERAL MAMMOGRAM WITH TOMO AND CAD

[R MLO synth-2D]
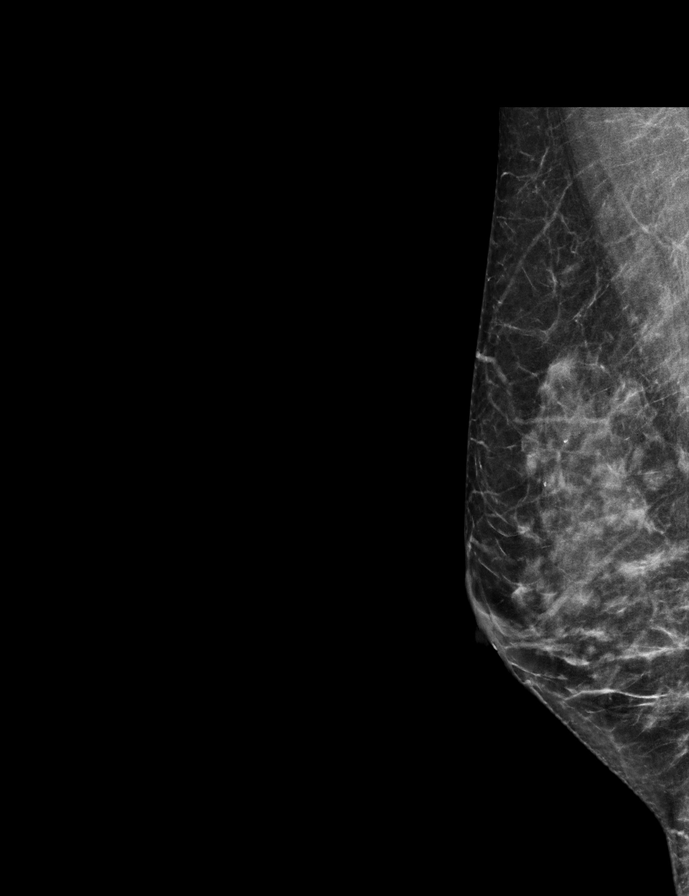

[L MLO synth-2D]
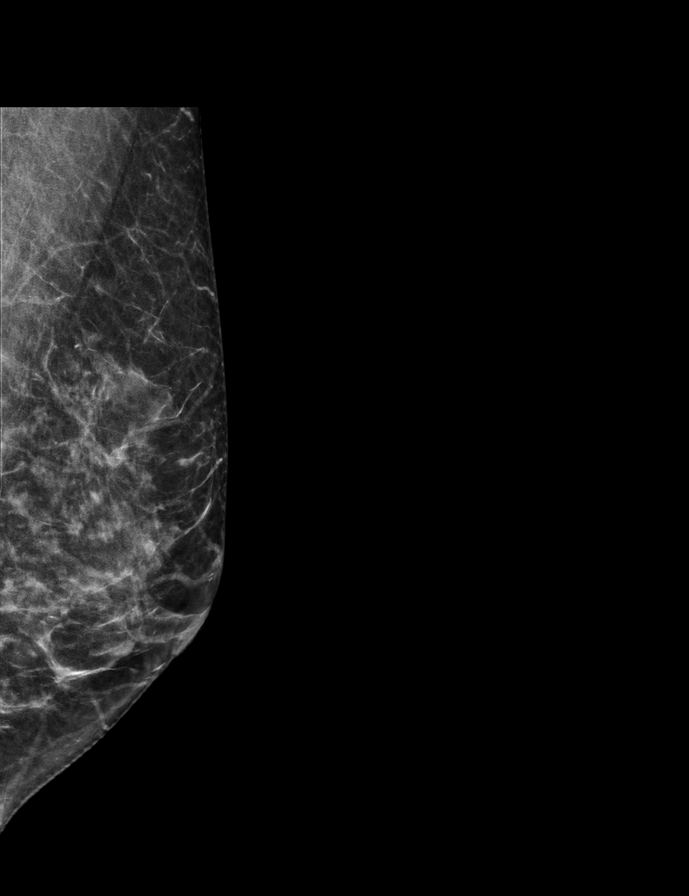

[L CC synth-2D]
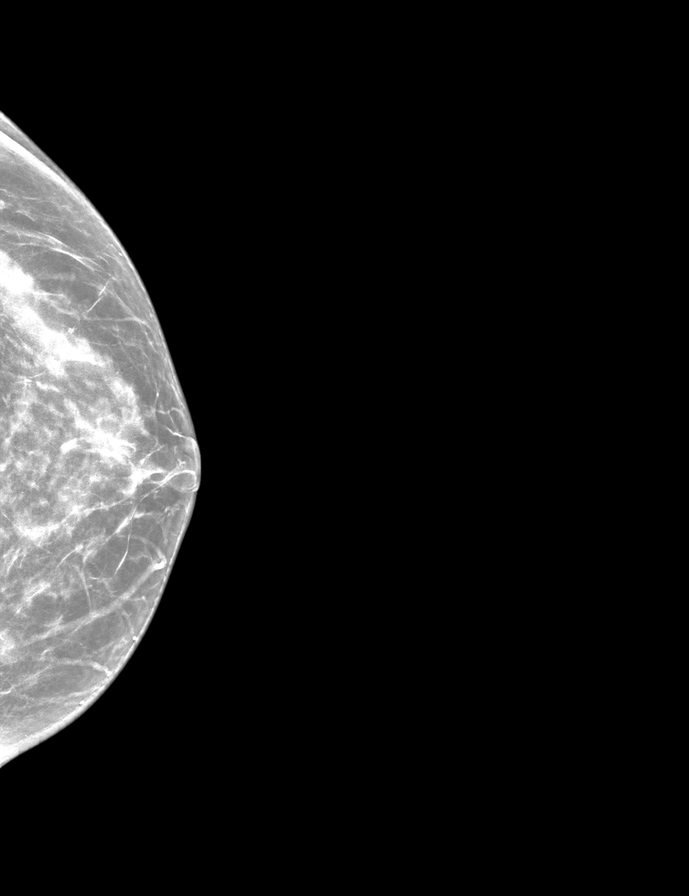

[R CC synth-2D]
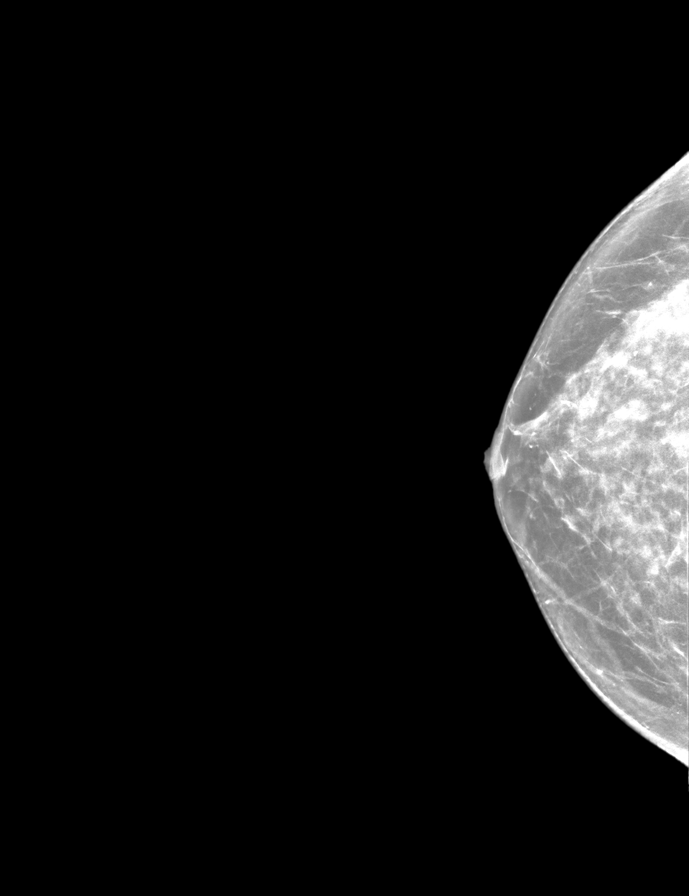

[R MLO tomo · 2 of 56 frames shown]
[frame 19/56]
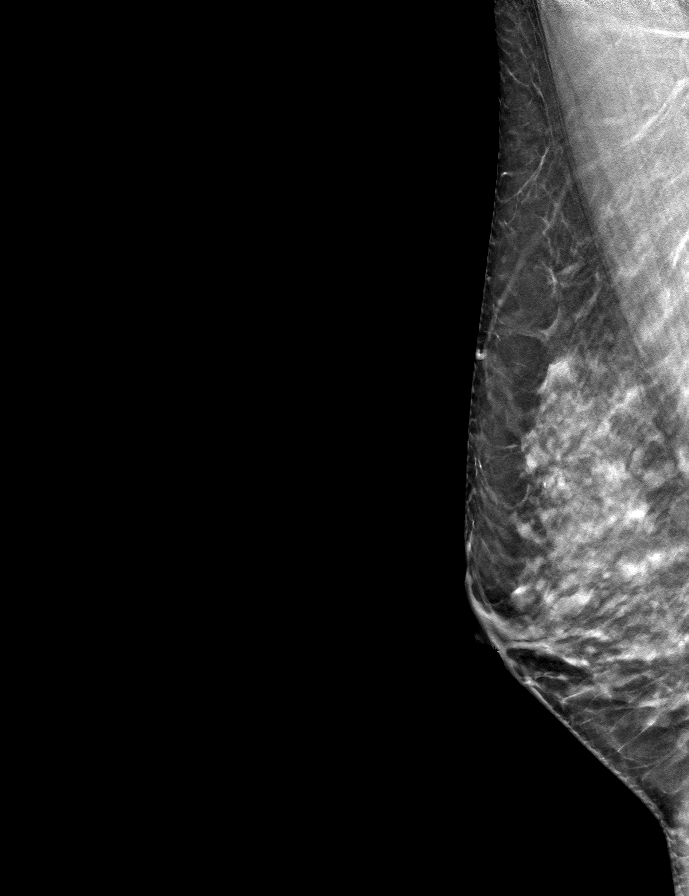
[frame 29/56]
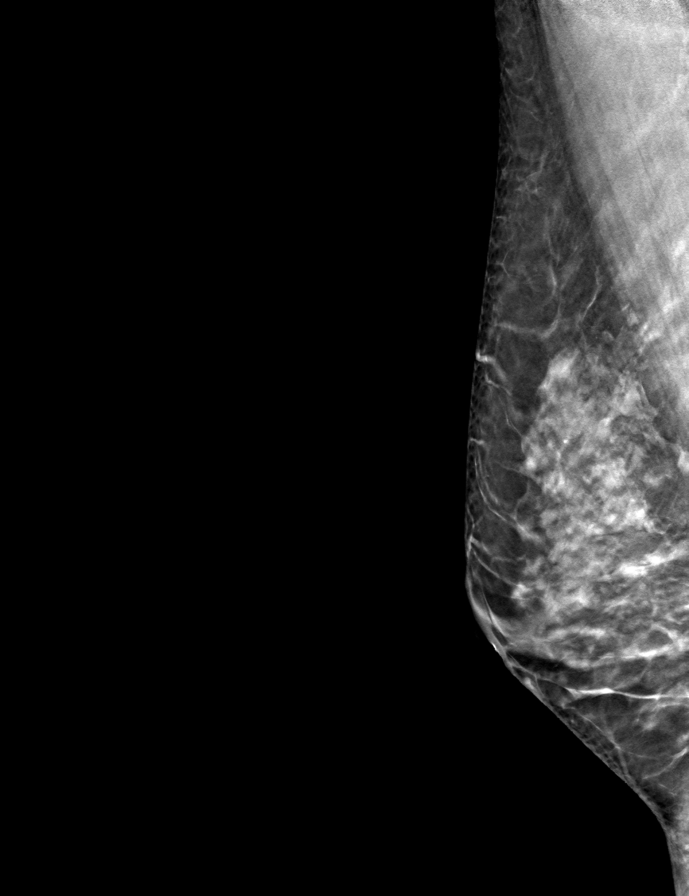

[R CC tomo · tomo slice 29/57.0]
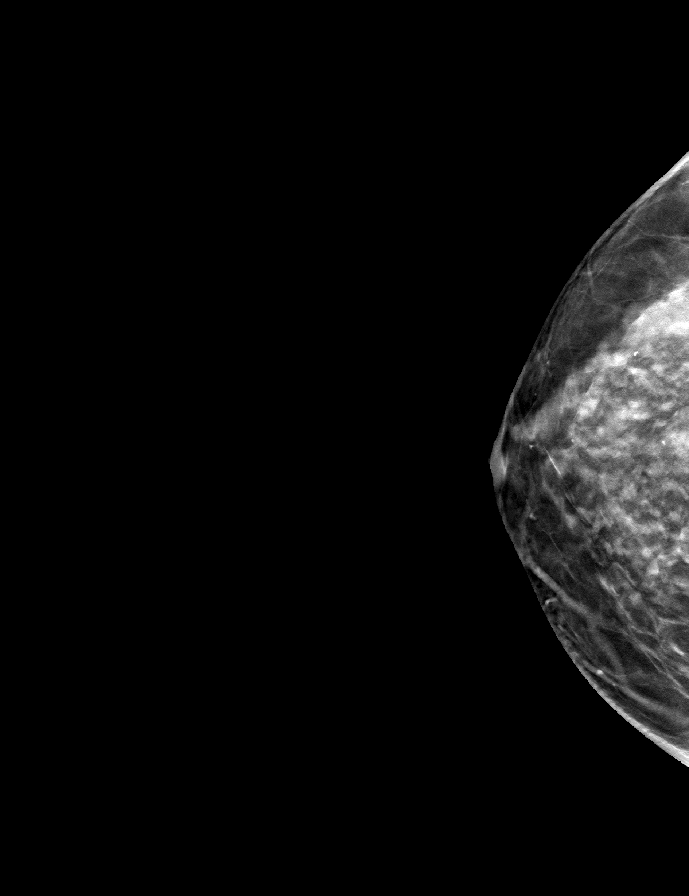

[L CC tomo · tomo slice 28/55.0]
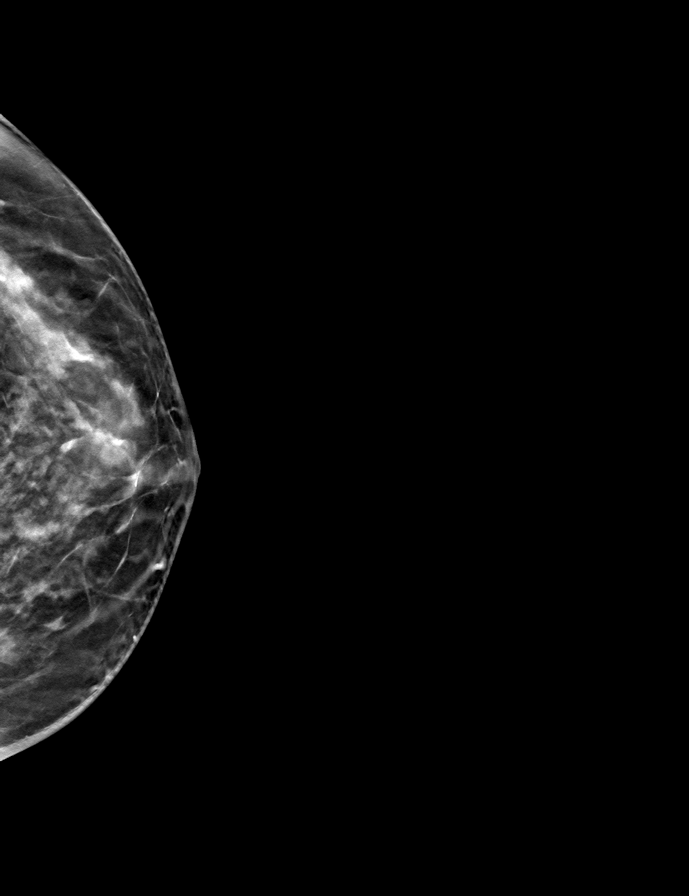

[L MLO tomo · tomo slice 27/53.0]
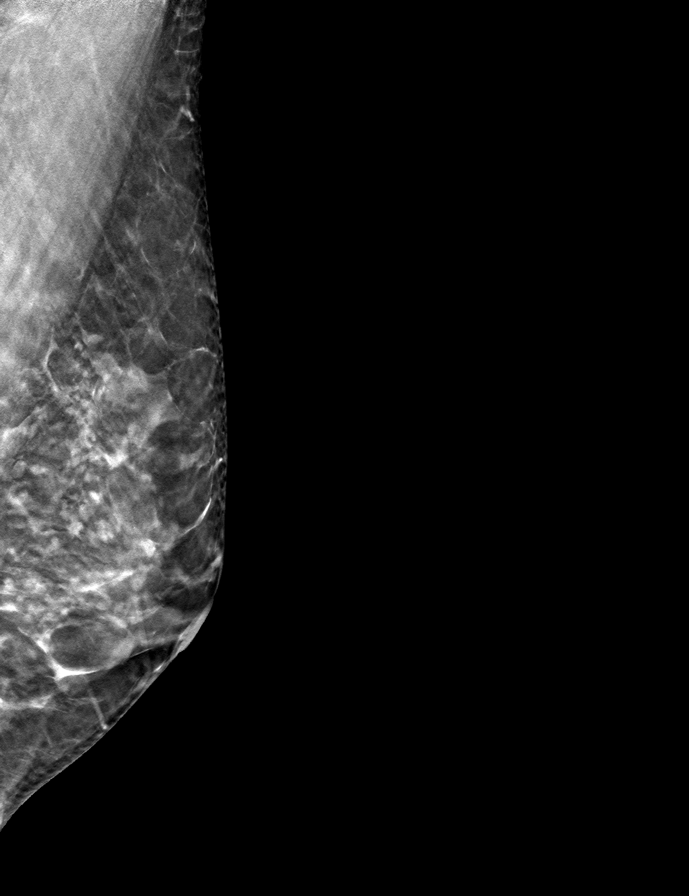

[9 of 24 positions shown; findings below may reference images not displayed]

ACR Breast Density Category c: The breast tissue is heterogeneously
dense, which may obscure small masses.
FINDINGS: There are no findings suspicious for malignancy. Images were
processed with CAD.
IMPRESSION: No mammographic evidence of malignancy. A result letter of this
screening mammogram will be mailed directly to the patient.

RECOMMENDATION:
Screening mammogram in one year. (Code:FT-U-LHB)

BI-RADS CATEGORY  1: Negative.

## 2018-07-13 ENCOUNTER — Other Ambulatory Visit: Payer: Self-pay | Admitting: Family Medicine

## 2018-08-03 ENCOUNTER — Other Ambulatory Visit: Payer: Self-pay | Admitting: Family Medicine

## 2024-01-23 ENCOUNTER — Encounter: Payer: Self-pay | Admitting: Emergency Medicine

## 2024-01-23 ENCOUNTER — Ambulatory Visit: Admission: EM | Admit: 2024-01-23 | Discharge: 2024-01-23 | Disposition: A | Discharge status: Home / Self Care

## 2024-01-23 DIAGNOSIS — J069 Acute upper respiratory infection, unspecified: Secondary | ICD-10-CM

## 2024-01-23 MED ORDER — CETIRIZINE-PSEUDOEPHEDRINE ER 5-120 MG PO TB12: 1.0000 | ORAL_TABLET | Freq: Every day | ORAL | 0 refills | Status: AC

## 2024-01-23 MED ORDER — AZITHROMYCIN 250 MG PO TABS: 250.0000 mg | ORAL_TABLET | Freq: Every day | ORAL | 0 refills | Status: AC

## 2024-01-23 MED ORDER — PSEUDOEPH-BROMPHEN-DM 30-2-10 MG/5ML PO SYRP: 10.0000 mL | ORAL_SOLUTION | Freq: Four times a day (QID) | ORAL | 0 refills | Status: AC | PRN

## 2024-01-23 MED ORDER — PREDNISONE 20 MG PO TABS: 40.0000 mg | ORAL_TABLET | Freq: Every day | ORAL | 0 refills | Status: AC

## 2024-01-23 MED ORDER — FLUTICASONE PROPIONATE 50 MCG/ACT NA SUSP: 2.0000 | Freq: Every day | NASAL | 0 refills | Status: AC

## 2024-01-23 NOTE — ED Provider Notes (Signed)
 [facility]    CSN: [id number] Arrival date & time: 01/23/24  0955      History   Chief Complaint Chief Complaint  Patient presents with   Cough    HPI Madison Best is a 58 y.o. female.   Discussed the use of AI scribe software for clinical note transcription with the patient, who gave verbal consent to proceed.   The patient presents with cough and generalized malaise that began last Sunday, now ongoing for approximately one week. She reports associated symptoms including headache, sneezing, and persistent coughing that causes chest discomfort during coughing episodes. The cough is largely nonproductive without significant sputum production. She denies sore throat, nausea, vomiting, or diarrhea. She reports postnasal drainage and fatigue but denies shortness of breath or wheezing.  For symptom management, she has attempted sinus rinses with minimal relief. She has been using Delsym, which partially suppresses the cough, noting that it prevents constant coughing but that when coughing episodes occur, they become prolonged. She also recently began using a humidifier at home two days ago.  The following sections of the patient's history were reviewed and updated as appropriate: allergies, current medications, past family history, past medical history, past social history, past surgical history, and problem list.       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      Home Medications    Prior to Admission medications   Medication Sig Start Date End Date Taking? Authorizing Provider  azithromycin  (ZITHROMAX ) 250 MG tablet Take 1 tablet (250 mg total) by mouth daily. Take first 2 tablets together, then 1 every day until finished. 01/23/24  Yes Iola Lukes, FNP  brompheniramine-pseudoephedrine -DM 30-2-10 MG/5ML syrup Take 10 mLs by mouth every 6 (six) hours as needed (cough and congestion). 01/23/24  Yes Deshonda Cryderman, FNP  cetirizine -pseudoephedrine  (ZYRTEC -D) 5-120 MG tablet Take 1 tablet by mouth daily with breakfast for 10 days. 01/23/24 02/02/24 Yes Jeramine Delis, FNP  clobetasol ointment (TEMOVATE) 0.05 % Apply twice daily as needed to elbows. Never on face. 10/30/21  Yes [provider]  fluticasone  (FLONASE ) 50 MCG/ACT nasal spray Place 2 sprays into both nostrils daily. Shake well before use. Gently blow nose before spraying. Do not blow nose immediately after use. You should not taste the medication or feel it going down your throat; if you do, adjust your technique. 01/23/24  Yes Matsue Strom, FNP  guselkumab [facility]) 100 MG/ML pen  09/10/23  Yes [provider]  hydrocortisone 2.5 % cream Apply twice daily to affected areas on the face 11/27/19  Yes [provider]  predniSONE  (DELTASONE ) 20 MG tablet Take 2 tablets (40 mg total) by mouth daily for 5 days. 01/23/24 01/28/24 Yes Iola Lukes, FNP    Family History Family History  Problem Relation Age of Onset   Arthritis Mother  Hypertension Mother    Diabetes Father    Cancer Sister        Breast/Brain tumors   Stroke Paternal Grandmother    Diabetes Paternal Grandmother    Breast cancer Neg Hx     Social History Social History   Tobacco Use   Smoking status: Former   Smokeless tobacco: Never  Substance Use Topics   Alcohol use: No   Drug use: No     Allergies    Patient has no known allergies.   Review of Systems Review of Systems  Constitutional:  Positive for fatigue. Negative for fever.  HENT:  Positive for congestion, rhinorrhea, sneezing and sore throat.   Respiratory:  Positive for cough. Negative for shortness of breath and wheezing.   Gastrointestinal:  Negative for diarrhea, nausea and vomiting.  Neurological:  Positive for headaches.  All other systems reviewed and are negative.    Physical Exam Triage Vital Signs ED Triage Vitals  Encounter Vitals Group     BP 01/23/24 1150 (!) 179/94     Girls Systolic BP Percentile --      Girls Diastolic BP Percentile --      Boys Systolic BP Percentile --      Boys Diastolic BP Percentile --      Pulse Rate 01/23/24 1150 90     Resp 01/23/24 1150 17     Temp 01/23/24 1150 98.1 F (36.7 C)     Temp Source 01/23/24 1150 Oral     SpO2 01/23/24 1150 99 %     Weight --      Height --      Head Circumference --      Peak Flow --      Pain Score 01/23/24 1153 9     Pain Loc --      Pain Education --      Exclude from Growth Chart --    No data found.  Updated Vital Signs BP (!) 142/87 (BP Location: Right Arm)   Pulse 90   Temp 98.1 F (36.7 C) (Oral)   Resp 17   SpO2 99%   Visual Acuity Right Eye Distance:   Left Eye Distance:   Bilateral Distance:    Right Eye Near:   Left Eye Near:    Bilateral Near:     Physical Exam Vitals reviewed.  Constitutional:      General: She is awake. She is not in acute distress.    Appearance: Normal appearance. She is well-developed. She is not ill-appearing, toxic-appearing or diaphoretic.  HENT:     Head: Normocephalic.     Right Ear: Tympanic membrane, ear canal and external ear normal. No drainage, swelling or tenderness. No middle ear effusion. Tympanic membrane is not erythematous.     Left Ear: Tympanic membrane, ear canal and external ear normal. No drainage, swelling or tenderness.  No middle ear effusion. Tympanic membrane  is not erythematous.     Nose: Congestion present. No rhinorrhea.     Mouth/Throat:     Lips: Pink.     Mouth: Mucous membranes are moist.     Pharynx: No pharyngeal swelling, oropharyngeal exudate, posterior oropharyngeal erythema or uvula swelling.     Tonsils: No tonsillar exudate or tonsillar abscesses.  Eyes:     General: Vision grossly intact.     Conjunctiva/sclera: Conjunctivae normal.  Cardiovascular:     Rate and Rhythm: Normal rate.     Heart sounds: Normal heart sounds.  Pulmonary:  Effort: Pulmonary effort is normal. No tachypnea or respiratory distress.     Breath sounds: Normal breath sounds and air entry.  Musculoskeletal:        General: Normal range of motion.     Cervical back: Normal range of motion and neck supple.  Lymphadenopathy:     Cervical: No cervical adenopathy.  Skin:    General: Skin is warm and dry.  Neurological:     General: No focal deficit present.     Mental Status: She is alert and oriented to person, place, and time.  Psychiatric:        Behavior: Behavior is cooperative.      UC Treatments / Results  Labs (all labs ordered are listed, but only abnormal results are displayed) Labs Reviewed - No data to display  EKG   Radiology No results found.  Procedures Procedures (including critical care time)  Medications Ordered in UC Medications - No data to display  Initial Impression / Assessment and Plan / UC Course  I have reviewed the triage vital signs and the nursing notes.  Pertinent labs & imaging results that were available during my care of the patient were reviewed by me and considered in my medical decision making (see chart for details).     The patient presents with symptoms consistent with an upper respiratory infection. Exam is reassuring and no evidence of acute cardiopulmonary process is noted. Medications prescribed and supportive care measures reviewed. Patient was advised to follow up with primary care if  symptoms do not improve within one week or if new concerns arise. Instructions were given to seek emergency care if symptoms worsen, including shortness of breath, chest pain, persistent high fever, inability to tolerate fluids, or confusion.  Today's evaluation has revealed no signs of a dangerous process. Discussed diagnosis with patient and/or guardian. Patient and/or guardian aware of their diagnosis, possible red flag symptoms to watch out for and need for close follow up. Patient and/or guardian understands verbal and written discharge instructions. Patient and/or guardian comfortable with plan and disposition.  Patient and/or guardian has a clear mental status at this time, good insight into illness (after discussion and teaching) and has clear judgment to make decisions regarding their care  Documentation was completed with the aid of voice recognition software. Transcription may contain typographical errors.   Final Clinical Impressions(s) / UC Diagnoses   Final diagnoses:  Upper respiratory tract infection, unspecified type     Discharge Instructions      Your symptoms are most likely due to a respiratory infection affecting your nose, throat, or lungs.  Please take the medications prescribed to you as directed. For fever, body aches, or throat pain, you may use acetaminophen (Tylenol) or ibuprofen (Advil, Motrin) as needed. Saline nasal spray or saline rinses can also be used freely to help clear mucus. Sore throat discomfort may improve with throat lozenges, warm saltwater gargles, or menthol/breathing strips. These treatments do not cure the illness but will help you feel more comfortable while your body heals. Staying hydrated is very important--drink plenty of fluids and aim for pale yellow urine. Using a cool mist humidifier, sitting in steam from a warm shower, and elevating your head when sleeping can also help with congestion and drainage. Be sure to replace your toothbrush once  you start feeling better. A cough can linger for several weeks after a respiratory infection even as other symptoms improve. This is normal as long as the cough is slowly getting better.  However, if your symptoms worsen, you have trouble breathing, chest pain, new high fever, coughing up blood, or you are unable to keep fluids down, please go to the emergency room or seek medical care right away. Follow up with your primary care provider if your symptoms are not improving within one week.      ED Prescriptions     Medication Sig Dispense Auth. Provider   azithromycin  (ZITHROMAX ) 250 MG tablet Take 1 tablet (250 mg total) by mouth daily. Take first 2 tablets together, then 1 every day until finished. 6 tablet Iola Lukes, FNP   predniSONE  (DELTASONE ) 20 MG tablet Take 2 tablets (40 mg total) by mouth daily for 5 days. 10 tablet Iola Lukes, FNP   cetirizine -pseudoephedrine  (ZYRTEC -D) 5-120 MG tablet Take 1 tablet by mouth daily with breakfast for 10 days. 10 tablet Iola Lukes, FNP   brompheniramine-pseudoephedrine -DM 30-2-10 MG/5ML syrup Take 10 mLs by mouth every 6 (six) hours as needed (cough and congestion). 120 mL Iola Lukes, FNP   fluticasone  (FLONASE ) 50 MCG/ACT nasal spray Place 2 sprays into both nostrils daily. Shake well before use. Gently blow nose before spraying. Do not blow nose immediately after use. You should not taste the medication or feel it going down your throat; if you do, adjust your technique. 16 g Iola Lukes, FNP      PDMP not reviewed this encounter.   Iola Lukes, [place] 01/23/24 1246

## 2024-01-23 NOTE — Discharge Instructions (Signed)
 Your symptoms are most likely due to a respiratory infection affecting your nose, throat, or lungs.  Please take the medications prescribed to you as directed. For fever, body aches, or throat pain, you may use acetaminophen (Tylenol) or ibuprofen (Advil, Motrin) as needed. Saline nasal spray or saline rinses can also be used freely to help clear mucus. Sore throat discomfort may improve with throat lozenges, warm saltwater gargles, or menthol/breathing strips. These treatments do not cure the illness but will help you feel more comfortable while your body heals. Staying hydrated is very important--drink plenty of fluids and aim for pale yellow urine. Using a cool mist humidifier, sitting in steam from a warm shower, and elevating your head when sleeping can also help with congestion and drainage. Be sure to replace your toothbrush once you start feeling better. A cough can linger for several weeks after a respiratory infection even as other symptoms improve. This is normal as long as the cough is slowly getting better. However, if your symptoms worsen, you have trouble breathing, chest pain, new high fever, coughing up blood, or you are unable to keep fluids down, please go to the emergency room or seek medical care right away. Follow up with your primary care provider if your symptoms are not improving within one week.

## 2024-01-23 NOTE — ED Triage Notes (Signed)
 Pt c/o headache, sneezing, chest discomfort, lots of coughing for 1 week.
